# Patient Record
Sex: Male | Born: 1960 | Race: Black or African American | Hispanic: No | Marital: Single | Smoking: Never smoker
Health system: Southern US, Community
[De-identification: ages and names within clinical notes are randomized; demographics above are authoritative.]

## PROBLEM LIST (undated history)

## (undated) DIAGNOSIS — R51 Headache: Secondary | ICD-10-CM

## (undated) DIAGNOSIS — E785 Hyperlipidemia, unspecified: Secondary | ICD-10-CM

## (undated) DIAGNOSIS — H409 Unspecified glaucoma: Secondary | ICD-10-CM

## (undated) DIAGNOSIS — Z9989 Dependence on other enabling machines and devices: Secondary | ICD-10-CM

## (undated) DIAGNOSIS — I10 Essential (primary) hypertension: Secondary | ICD-10-CM

## (undated) DIAGNOSIS — G4733 Obstructive sleep apnea (adult) (pediatric): Secondary | ICD-10-CM

## (undated) DIAGNOSIS — T7840XA Allergy, unspecified, initial encounter: Secondary | ICD-10-CM

## (undated) DIAGNOSIS — R634 Abnormal weight loss: Secondary | ICD-10-CM

## (undated) DIAGNOSIS — F419 Anxiety disorder, unspecified: Secondary | ICD-10-CM

## (undated) DIAGNOSIS — Q120 Congenital cataract: Secondary | ICD-10-CM

## (undated) DIAGNOSIS — K219 Gastro-esophageal reflux disease without esophagitis: Secondary | ICD-10-CM

## (undated) HISTORY — DX: Hyperlipidemia, unspecified: E78.5

## (undated) HISTORY — PX: SHOULDER ARTHROSCOPY WITH ROTATOR CUFF REPAIR: SHX5685

## (undated) HISTORY — DX: Dependence on other enabling machines and devices: Z99.89

## (undated) HISTORY — DX: Abnormal weight loss: R63.4

## (undated) HISTORY — DX: Allergy, unspecified, initial encounter: T78.40XA

## (undated) HISTORY — DX: Anxiety disorder, unspecified: F41.9

## (undated) HISTORY — DX: Obstructive sleep apnea (adult) (pediatric): G47.33

## (undated) HISTORY — DX: Essential (primary) hypertension: I10

## (undated) HISTORY — DX: Congenital cataract: Q12.0

## (undated) HISTORY — DX: Unspecified glaucoma: H40.9

## (undated) HISTORY — DX: Gastro-esophageal reflux disease without esophagitis: K21.9

---
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
## 2003-11-05 ENCOUNTER — Ambulatory Visit (HOSPITAL_COMMUNITY): Admission: RE | Admit: 2003-11-05 | Discharge: 2003-11-05 | Payer: Self-pay | Admitting: Family Medicine

## 2003-11-29 ENCOUNTER — Emergency Department (HOSPITAL_COMMUNITY): Admission: AD | Admit: 2003-11-29 | Discharge: 2003-11-29 | Payer: Self-pay | Admitting: Family Medicine

## 2003-12-20 ENCOUNTER — Encounter: Admission: RE | Admit: 2003-12-20 | Discharge: 2003-12-20 | Payer: Self-pay | Admitting: Emergency Medicine

## 2004-11-07 ENCOUNTER — Emergency Department (HOSPITAL_COMMUNITY): Admission: EM | Admit: 2004-11-07 | Discharge: 2004-11-07 | Payer: Self-pay | Admitting: Family Medicine

## 2005-05-07 ENCOUNTER — Encounter: Admission: RE | Admit: 2005-05-07 | Discharge: 2005-05-07 | Payer: Self-pay | Admitting: Emergency Medicine

## 2005-07-02 ENCOUNTER — Emergency Department (HOSPITAL_COMMUNITY): Admission: EM | Admit: 2005-07-02 | Discharge: 2005-07-02 | Payer: Self-pay | Admitting: Family Medicine

## 2006-01-01 ENCOUNTER — Emergency Department (HOSPITAL_COMMUNITY): Admission: EM | Admit: 2006-01-01 | Discharge: 2006-01-01 | Payer: Self-pay | Admitting: Family Medicine

## 2006-01-12 ENCOUNTER — Encounter: Admission: RE | Admit: 2006-01-12 | Discharge: 2006-01-12 | Payer: Self-pay | Admitting: Emergency Medicine

## 2006-05-04 ENCOUNTER — Emergency Department (HOSPITAL_COMMUNITY): Admission: EM | Admit: 2006-05-04 | Discharge: 2006-05-04 | Payer: Self-pay | Admitting: Family Medicine

## 2006-11-14 ENCOUNTER — Emergency Department (HOSPITAL_COMMUNITY): Admission: EM | Admit: 2006-11-14 | Discharge: 2006-11-14 | Payer: Self-pay | Admitting: Family Medicine

## 2007-11-16 HISTORY — PX: SHOULDER ARTHROSCOPY: SHX128

## 2009-11-04 ENCOUNTER — Emergency Department (HOSPITAL_COMMUNITY): Admission: EM | Admit: 2009-11-04 | Discharge: 2009-11-04 | Payer: Self-pay | Admitting: Emergency Medicine

## 2010-06-17 ENCOUNTER — Encounter: Admission: RE | Admit: 2010-06-17 | Discharge: 2010-06-17 | Payer: Self-pay | Admitting: Family Medicine

## 2011-11-09 ENCOUNTER — Ambulatory Visit (INDEPENDENT_AMBULATORY_CARE_PROVIDER_SITE_OTHER): Payer: Commercial Indemnity

## 2011-11-09 DIAGNOSIS — J209 Acute bronchitis, unspecified: Secondary | ICD-10-CM

## 2011-12-27 ENCOUNTER — Ambulatory Visit (INDEPENDENT_AMBULATORY_CARE_PROVIDER_SITE_OTHER): Payer: Commercial Indemnity | Admitting: Physician Assistant

## 2011-12-27 VITALS — BP 119/79 | HR 69 | Temp 98.7°F | Resp 16 | Ht 69.0 in | Wt 176.0 lb

## 2011-12-27 DIAGNOSIS — Z9989 Dependence on other enabling machines and devices: Secondary | ICD-10-CM

## 2011-12-27 DIAGNOSIS — I10 Essential (primary) hypertension: Secondary | ICD-10-CM | POA: Insufficient documentation

## 2011-12-27 DIAGNOSIS — G43909 Migraine, unspecified, not intractable, without status migrainosus: Secondary | ICD-10-CM | POA: Insufficient documentation

## 2011-12-27 DIAGNOSIS — L509 Urticaria, unspecified: Secondary | ICD-10-CM

## 2011-12-27 DIAGNOSIS — E785 Hyperlipidemia, unspecified: Secondary | ICD-10-CM

## 2011-12-27 DIAGNOSIS — T7840XA Allergy, unspecified, initial encounter: Secondary | ICD-10-CM

## 2011-12-27 DIAGNOSIS — F419 Anxiety disorder, unspecified: Secondary | ICD-10-CM

## 2011-12-27 DIAGNOSIS — J309 Allergic rhinitis, unspecified: Secondary | ICD-10-CM

## 2011-12-27 DIAGNOSIS — L239 Allergic contact dermatitis, unspecified cause: Secondary | ICD-10-CM

## 2011-12-27 DIAGNOSIS — G4733 Obstructive sleep apnea (adult) (pediatric): Secondary | ICD-10-CM | POA: Insufficient documentation

## 2011-12-27 DIAGNOSIS — E291 Testicular hypofunction: Secondary | ICD-10-CM

## 2011-12-27 DIAGNOSIS — K219 Gastro-esophageal reflux disease without esophagitis: Secondary | ICD-10-CM

## 2011-12-27 MED ORDER — PREDNISONE 20 MG PO TABS: ORAL_TABLET | ORAL | Status: AC

## 2011-12-27 MED ORDER — HYDROXYZINE HCL 25 MG PO TABS: 25.0000 mg | ORAL_TABLET | Freq: Three times a day (TID) | ORAL | Status: AC | PRN

## 2011-12-27 NOTE — Patient Instructions (Signed)
Take medications as prescribed.  Try to keep cool, the hotter you get, the itchier you'll be!  Stay well hydrated.

## 2011-12-27 NOTE — Progress Notes (Signed)
  Subjective:    Patient ID: William [name], male    DOB: 1961/09/12, 51 y.o.   MRN: [id number]  HPI The patient presents with itchy rash in the antecubital fossa bilaterally as well as the upper chest. The symptoms began several hours after applying hair to test his sensitivity to it. No oral or pulmonary symptoms. No headache. Oral Benadryl and topical hydrocortisone have been ineffective.   Review of Systems No chest pain, shortness of breath, tongue swelling, throat scratchiness.    Objective:   Physical Exam Urticarial changes noted in the antecubital fossa bilaterally. Some mild increased erythema of the upper chest without hives.     Assessment & Plan:  Allergic reaction. Urticaria.  Hydroxyzine 25 mg one by mouth Q8 hours when necessary (advise just at bedtime secondary to sedative side effects). Prednisone taper starting tomorrow morning. Hydrate. Keep cool. Reevaluate if symptoms worsen or persist.

## 2012-02-22 ENCOUNTER — Telehealth: Payer: Self-pay

## 2012-02-22 MED ORDER — EZETIMIBE 10 MG PO TABS: 10.0000 mg | ORAL_TABLET | Freq: Every day | ORAL | Status: DC

## 2012-02-22 NOTE — Telephone Encounter (Signed)
.  [facility] Patient called to request refill of Zetia be sent to [facility].  Patient stated that there had been some issues with this medication getting refilled. Please call patient at [phone].

## 2012-02-22 NOTE — Telephone Encounter (Signed)
Advised pt that rx was sent in and needs an office visit before running out

## 2012-03-06 ENCOUNTER — Ambulatory Visit (INDEPENDENT_AMBULATORY_CARE_PROVIDER_SITE_OTHER): Payer: Commercial Indemnity | Admitting: Family Medicine

## 2012-03-06 VITALS — BP 130/83 | HR 59 | Temp 98.1°F | Resp 16 | Ht 66.38 in | Wt 176.6 lb

## 2012-03-06 DIAGNOSIS — R21 Rash and other nonspecific skin eruption: Secondary | ICD-10-CM

## 2012-03-06 MED ORDER — TERBINAFINE HCL 250 MG PO TABS: 250.0000 mg | ORAL_TABLET | Freq: Every day | ORAL | Status: AC

## 2012-03-06 NOTE — Progress Notes (Signed)
  Patient Name: William [name] Date of Birth: 03-10-1961 Medical Record Number: [id number] Gender: male Date of Encounter: 03/06/2012  History of Present Illness:  William [name] is a 51 y.o. very pleasant male patient who presents with the following:  Has had a rash on his neck for about 2 weeks.  It got worse after he wore a shirt/ tie to church and it rubbed on the area.  He switched from a regular razor to an Neurosurgeon but it did not help.  He noted some round, scaly areas and was wondering about ringworm- he applied a topical antifungal last night.  He has never had problems with irritation or redness of his neck in the past and notes that he does not have much hair growth in his beard area which helps. Otherwise is feeling well.    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 HTN (hypertension)  . Hyperlipidemia  . GERD (gastroesophageal reflux disease)  . OSA on CPAP  . Hypogonadism male  . Migraine  . AR (allergic rhinitis)  . Anxiety   Past Medical History  Diagnosis Date  . GERD (gastroesophageal reflux disease)   . Hypertension   . Hyperlipidemia   . Allergy   . Anxiety   . OSA on CPAP    No past surgical history on file. History  Substance Use Topics  . Smoking status: Never Smoker   . Smokeless tobacco: Not on file  . Alcohol Use: Not on file   No family history on file. No Known Allergies  Medication list has been reviewed and updated.  Review of Systems: As per HPI- otherwise negative.  Physical Examination: Filed Vitals:   03/06/12 1819  BP: 130/83  Pulse: 59  Temp: 98.1 F (36.7 C)  TempSrc: Oral  Resp: 16  Height: 5' 6.38" (1.686 m)  Weight: 176 lb 9.6 oz (80.105 kg)    Body mass index is 28.18 kg/(m^2).   GEN: WDWN, NAD, Non-toxic, Alert & Oriented x 3 HEENT: Atraumatic, Normocephalic.  Ears and Nose: No external deformity. EXTR: No clubbing/cyanosis/edema NEURO: Normal gait.  PSYCH:  Normally interactive. Conversant. Not depressed or anxious appearing.  Calm demeanor.   There is a round, scaly are on the right cheek and similar area on the right neck.  Did a scraping but did not get a great sample Results for orders placed in visit on 03/06/12  POCT SKIN KOH      Component Value Range   Skin KOH, POC Negative     Assessment and Plan: 1. Rash  POCT Skin KOH, terbinafine (LAMISIL) 250 MG tablet   Suspect that rash may be due to tinea- he would like to try treating this orally.   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  William [name] to recheck his labs prior to starting medication- as he has normal labs in September he declined to recheck today.  He will let me know if not getting better- Sooner if worse.

## 2012-04-01 ENCOUNTER — Telehealth: Payer: Self-pay

## 2012-04-01 NOTE — Telephone Encounter (Signed)
Pt's rx company keeps losing his rx and he is not able to get his androgel. He would like to have [place] [facility]

## 2012-04-02 MED ORDER — TESTOSTERONE 12.5 MG/ACT (1%) TD GEL: 5.0000 g | Freq: Every day | TRANSDERMAL | Status: DC

## 2012-04-02 NOTE — Telephone Encounter (Signed)
Will review patient chart.

## 2012-04-02 NOTE — Telephone Encounter (Signed)
Please call patient.  1) verify that he wants the Rx to [facility], not [facility]. I'm waiting to authorize it until we're sure.  2) He'll need re-evaluation for additional refills.

## 2012-04-02 NOTE — Telephone Encounter (Signed)
[facility] has been bought out by [employer].  Will fax in rx and call pt to advise recheck.

## 2012-06-08 ENCOUNTER — Ambulatory Visit (INDEPENDENT_AMBULATORY_CARE_PROVIDER_SITE_OTHER): Payer: Commercial Indemnity | Admitting: Family Medicine

## 2012-06-08 ENCOUNTER — Encounter: Payer: Self-pay | Admitting: Family Medicine

## 2012-06-08 VITALS — BP 136/102 | HR 87 | Temp 98.2°F | Resp 16 | Ht 67.0 in | Wt 172.8 lb

## 2012-06-08 DIAGNOSIS — I10 Essential (primary) hypertension: Secondary | ICD-10-CM

## 2012-06-08 DIAGNOSIS — E291 Testicular hypofunction: Secondary | ICD-10-CM

## 2012-06-08 DIAGNOSIS — Z0000 Encounter for general adult medical examination without abnormal findings: Secondary | ICD-10-CM

## 2012-06-08 DIAGNOSIS — M791 Myalgia, unspecified site: Secondary | ICD-10-CM

## 2012-06-08 DIAGNOSIS — Z202 Contact with and (suspected) exposure to infections with a predominantly sexual mode of transmission: Secondary | ICD-10-CM

## 2012-06-08 DIAGNOSIS — J309 Allergic rhinitis, unspecified: Secondary | ICD-10-CM

## 2012-06-08 DIAGNOSIS — E785 Hyperlipidemia, unspecified: Secondary | ICD-10-CM

## 2012-06-08 DIAGNOSIS — R361 Hematospermia: Secondary | ICD-10-CM

## 2012-06-08 DIAGNOSIS — G44009 Cluster headache syndrome, unspecified, not intractable: Secondary | ICD-10-CM

## 2012-06-08 LAB — POCT URINALYSIS DIPSTICK
Bilirubin, UA: NEGATIVE
Glucose, UA: NEGATIVE
Leukocytes, UA: NEGATIVE
Nitrite, UA: NEGATIVE
Protein, UA: 30
Spec Grav, UA: 1.02
Urobilinogen, UA: 0.2
pH, UA: 7

## 2012-06-08 LAB — LIPID PANEL
Cholesterol: 218 mg/dL — ABNORMAL HIGH (ref 0–200)
HDL: 45 mg/dL (ref >39)
LDL Cholesterol: 155 mg/dL — ABNORMAL HIGH (ref 0–99)
Total CHOL/HDL Ratio: 4.8 Ratio
Triglycerides: 89 mg/dL (ref <150)
VLDL: 18 mg/dL (ref 0–40)

## 2012-06-08 LAB — CBC
HCT: 43.1 % (ref 39.0–52.0)
Hemoglobin: 15.3 g/dL (ref 13.0–17.0)
MCH: 29.5 pg (ref 26.0–34.0)
MCHC: 35.5 g/dL (ref 30.0–36.0)
MCV: 83 fL (ref 78.0–100.0)
Platelets: 235 10*3/uL (ref 150–400)
RBC: 5.19 MIL/uL (ref 4.22–5.81)
RDW: 15.1 % (ref 11.5–15.5)
WBC: 4 10*3/uL (ref 4.0–10.5)

## 2012-06-08 LAB — POCT UA - MICROSCOPIC ONLY
Casts, Ur, LPF, POC: NEGATIVE
Crystals, Ur, HPF, POC: NEGATIVE
Mucus, UA: POSITIVE
Yeast, UA: NEGATIVE

## 2012-06-08 LAB — COMPREHENSIVE METABOLIC PANEL
ALT: 27 U/L (ref 0–53)
AST: 32 U/L (ref 0–37)
Albumin: 4.3 g/dL (ref 3.5–5.2)
Alkaline Phosphatase: 45 U/L (ref 39–117)
BUN: 13 mg/dL (ref 6–23)
CO2: 26 mEq/L (ref 19–32)
Calcium: 9.5 mg/dL (ref 8.4–10.5)
Chloride: 104 mEq/L (ref 96–112)
Creat: 1.19 mg/dL (ref 0.50–1.35)
Glucose, Bld: 92 mg/dL (ref 70–99)
Potassium: 4 mEq/L (ref 3.5–5.3)
Sodium: 139 mEq/L (ref 135–145)
Total Bilirubin: 0.6 mg/dL (ref 0.3–1.2)
Total Protein: 7.2 g/dL (ref 6.0–8.3)

## 2012-06-08 LAB — TSH: TSH: 1.492 u[IU]/mL (ref 0.350–4.500)

## 2012-06-08 LAB — PSA: PSA: 0.63 ng/mL (ref <=4.00)

## 2012-06-08 LAB — IFOBT (OCCULT BLOOD): IFOBT: NEGATIVE

## 2012-06-08 MED ORDER — TESTOSTERONE 12.5 MG/ACT (1%) TD GEL: 5.0000 g | Freq: Every day | TRANSDERMAL | Status: DC

## 2012-06-08 MED ORDER — FLUTICASONE PROPIONATE 50 MCG/ACT NA SUSP: 2.0000 | Freq: Every day | NASAL | Status: DC

## 2012-06-08 MED ORDER — VERAPAMIL HCL 240 MG (CO) PO TB24: 240.0000 mg | ORAL_TABLET | Freq: Every day | ORAL | Status: DC

## 2012-06-08 MED ORDER — SUMATRIPTAN SUCCINATE 100 MG PO TABS: 100.0000 mg | ORAL_TABLET | ORAL | Status: DC | PRN

## 2012-06-08 MED ORDER — EZETIMIBE 10 MG PO TABS: 10.0000 mg | ORAL_TABLET | Freq: Every day | ORAL | Status: DC

## 2012-06-08 MED ORDER — CYCLOBENZAPRINE HCL 10 MG PO TABS: 10.0000 mg | ORAL_TABLET | ORAL | Status: DC | PRN

## 2012-06-08 NOTE — Progress Notes (Signed)
122/92  @[facility] @  Patient ID: William [name] MRN: [id number], DOB: 21-Nov-1960 51 y.o. Date of Encounter: 06/08/2012, 9:17 AM  Primary Physician: No primary provider on file.  Chief Complaint: Physical (CPE)  HPI: 51 y.o. y/o male with history noted below here for CPE.  Doing well. 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 He's not very happy with his job currently. Had a relationship and was exiting gauge last year and then found out difficulties including the fact that his fiance had contracted HSV. He recently has noted some hematospermia . These issues have caused increased stress lately and patient thinks some of his elevated blood pressure may be because of this. He is working out with a Psychologist, educational at [employer] for exercise. He's having no further problems with the shoulders except occasional myalgia.  Review of Systems: Consitutional: No fever, chills, fatigue, night sweats, lymphadenopathy, or weight changes. Eyes: No visual changes, eye redness, or discharge. ENT/Mouth: Ears: No otalgia, tinnitus, hearing loss, discharge. Nose: No congestion, rhinorrhea, sinus pain, or epistaxis. Throat: No sore throat, post nasal drip, or teeth pain. Cardiovascular: No CP, palpitations, diaphoresis, DOE, edema, orthopnea, PND. Respiratory: No cough, hemoptysis, SOB, or wheezing. Gastrointestinal: No anorexia, dysphagia, reflux, pain, nausea, vomiting, hematemesis, diarrhea, constipation, BRBPR, or melena. Genitourinary: No dysuria, frequency, urgency, hematuria, incontinence, nocturia, decreased urinary stream, discharge, impotence, or testicular pain/masses. Musculoskeletal: No decreased ROM, myalgias, stiffness, joint swelling, or weakness. Skin: No rash, erythema, lesion changes, pain, warmth, jaundice, or pruritis. Neurological: No headache, dizziness, syncope, seizures, tremors, memory loss, coordination problems, or  paresthesias. Psychological: No anxiety, depression, hallucinations, SI/HI. Endocrine: No fatigue, polydipsia, polyphagia, polyuria, or known diabetes. All other systems were reviewed and are otherwise negative.  Past Medical History  Diagnosis Date  . GERD (gastroesophageal reflux disease)   . Hypertension   . Hyperlipidemia   . Allergy   . Anxiety   . OSA on CPAP      No past surgical history on file.  Home Meds:  Prior to Admission medications   Medication Sig Start Date End Date Taking? Authorizing Provider  cyclobenzaprine (FLEXERIL) 10 MG tablet Take 1 tablet (10 mg total) by mouth as needed. 06/08/12  Yes Elvina Sidle, MD  ezetimibe (ZETIA) 10 MG tablet Take 1 tablet (10 mg total) by mouth daily. 06/08/12  Yes Elvina Sidle, MD  fluticasone [facility]) 50 MCG/ACT nasal spray Place 2 sprays into the nose daily. 06/08/12  Yes Elvina Sidle, MD  latanoprost (XALATAN) 0.005 % ophthalmic solution Place 1 drop into both eyes at bedtime.   Yes Historical Provider, MD  Multiple Vitamin (MULTIVITAMIN) capsule Take 1 capsule by mouth daily.   Yes Historical Provider, MD  SUMAtriptan (IMITREX) 100 MG tablet Take 1 tablet (100 mg total) by mouth every 2 (two) hours as needed. 06/08/12  Yes Elvina Sidle, MD  Testosterone 1.25 GM/ACT (1%) GEL Place 5 g onto the skin daily. 06/08/12  Yes Elvina Sidle, MD  verapamil (COVERA HS) 240 MG (CO) 24 hr tablet Take 1 tablet (240 mg total) by mouth daily. 06/08/12  Yes Elvina Sidle, MD  terbinafine (LAMISIL) 250 MG tablet Take 1 tablet (250 mg total) by mouth daily. 03/06/12 03/06/13  Pearline Cables, MD    Allergies: Not on File  History   Social History  . Marital Status: Married    Spouse Name: N/A    Number of Children: N/A  . Years of  Education: N/A   Occupational History  . Not on file.   Social History Main Topics  . Smoking status: Never Smoker   . Smokeless tobacco: Not on file  . Alcohol Use: Not on file  . Drug Use:  Not on file  . Sexually Active: Not on file   Other Topics Concern  . Not on file   Social History Narrative  . No narrative on file    No family history on file. his father had prostate cancer which was treated with surgery successfully and is still alive. He also has a family history of glaucoma degrees and and hypercholesterolemia.  Physical Exam:  BP 122/92 when rechecked by me. Blood pressure 136/102, pulse 87, temperature 98.2 F (36.8 C), temperature source Oral, resp. rate 16, height 5\' 7"  (1.702 m), weight 172 lb 12.8 oz (78.382 kg), SpO2 99.00%.  General: Well developed, well nourished, in no acute distress. HEENT: Normocephalic, atraumatic. Conjunctiva pink, sclera non-icteric. Pupils 2 mm constricting to 1 mm, round, regular, and equally reactive to light and accomodation. EOMI. Internal auditory canal clear. TMs with good cone of light and without pathology. Nasal mucosa pink. Nares are without discharge. No sinus tenderness. Oral mucosa pink. Dentition good. Pharynx without exudate.   Neck: Supple. Trachea midline. No thyromegaly. Full ROM. No lymphadenopathy. Lungs: Clear to auscultation bilaterally without wheezes, rales, or rhonchi. Breathing is of normal effort and unlabored. Cardiovascular: RRR with S1 S2. No murmurs, rubs, or gallops appreciated. Distal pulses 2+ symmetrically. No carotid or abdominal bruits. Abdomen: Soft, non-tender, non-distended with normoactive bowel sounds. No hepatosplenomegaly or masses. No rebound/guarding. No CVA tenderness. Without hernias.  Rectal: No external hemorrhoids or fissures. Rectal vault without masses.  Genitourinary:  circumcised male. No penile lesions. Testes descended bilaterally, and smooth without tenderness or masses.  Musculoskeletal: Full range of motion and 5/5 strength throughout. Without swelling, atrophy, tenderness, crepitus, or warmth. Extremities without clubbing, cyanosis, or edema. Calves supple. Skin: Warm and  moist without erythema, ecchymosis, wounds, or rash. Patient does have keloid formation where he had shoulder arthroscopy on each of his shoulders. Neuro: A+Ox3. CN II-XII grossly intact. Moves all extremities spontaneously. Full sensation throughout. Normal gait. DTR 2+ throughout upper and lower extremities. Finger to nose intact. Psych:  Responds to questions appropriately with a normal affect.   Studies:  .  Assessment/Plan:  51 y.o. y/o healthy male here for CPE - 1. Hypertension  CBC, Comprehensive metabolic panel, TSH, POCT UA - Microscopic Only, POCT urinalysis dipstick, verapamil (COVERA HS) 240 MG (CO) 24 hr tablet  2. Hematospermia  PSA  3. Hyperlipidemia  Lipid panel, ezetimibe (ZETIA) 10 MG tablet  4. Hypogonadism male  Testosterone 1.25 GM/ACT (1%) GEL  5. Headaches, cluster  SUMAtriptan (IMITREX) 100 MG tablet  6. Allergic rhinitis  fluticasone (FLONASE) 50 MCG/ACT nasal spray  7. Myalgia  cyclobenzaprine (FLEXERIL) 10 MG tablet   HSV blood test added to labs.  Signed, Elvina Sidle, MD 06/08/2012 9:17 AM

## 2012-06-09 LAB — HSV(HERPES SIMPLEX VRS) I + II AB-IGG
HSV 1 Glycoprotein G Ab, IgG: 0.23 IV
HSV 2 Glycoprotein G Ab, IgG: 4.71 IV — ABNORMAL HIGH

## 2012-06-13 ENCOUNTER — Telehealth: Payer: Self-pay

## 2012-06-13 NOTE — Telephone Encounter (Signed)
Called [facility], His medicine is discontinued, Covera-HS 240 mg. The other alternatives are Verapamil generic. I advised generic verapamil ok to use.

## 2012-06-13 NOTE — Telephone Encounter (Signed)
Patient seen recently by Dr. Milus Glazier and had orders sent to [facility]. [facility] has questions and patient states this needs to be taken care of by today according to [facility]. The reference # is [id number] and the [facility] # is [phone].  Patient call back number is [phone] (cell).

## 2012-07-14 ENCOUNTER — Ambulatory Visit (INDEPENDENT_AMBULATORY_CARE_PROVIDER_SITE_OTHER): Payer: Commercial Indemnity | Admitting: Physician Assistant

## 2012-07-14 VITALS — BP 110/68 | HR 64 | Temp 97.8°F | Resp 16 | Ht 67.0 in | Wt 173.0 lb

## 2012-07-14 DIAGNOSIS — H6690 Otitis media, unspecified, unspecified ear: Secondary | ICD-10-CM

## 2012-07-14 DIAGNOSIS — H6980 Other specified disorders of Eustachian tube, unspecified ear: Secondary | ICD-10-CM

## 2012-07-14 DIAGNOSIS — H6990 Unspecified Eustachian tube disorder, unspecified ear: Secondary | ICD-10-CM

## 2012-07-14 MED ORDER — PREDNISONE 20 MG PO TABS: ORAL_TABLET | ORAL | Status: AC

## 2012-07-14 MED ORDER — AMOXICILLIN 875 MG PO TABS: 875.0000 mg | ORAL_TABLET | Freq: Two times a day (BID) | ORAL | Status: AC

## 2012-07-14 NOTE — Progress Notes (Signed)
  Subjective:    Patient ID: KMARION RAWL, male    DOB: [birth year], 51 y.o.   MRN: [id number]  HPI 51 year old male presents with 5 day history of right ear pressure, some otalgia, and decreased hearing.  Denies any recent URI - no nasal congestion, sore throat, cough, headache, fever, or chills. Did have a cerumen impaction that he had irrigated at his physical about 1 month ago, but has had no problems since then. Flushed his ear with water at home using a syringe but did not get much wax out.      Review of Systems  All other systems reviewed and are negative.       Objective:   Physical Exam  Constitutional: He is oriented to person, place, and time. He appears well-developed and well-nourished.  HENT:  Head: Normocephalic and atraumatic.  Right Ear: Hearing, external ear and ear canal normal. A middle ear effusion is present.  Left Ear: Hearing, tympanic membrane, external ear and ear canal normal.  Mouth/Throat: Uvula is midline, oropharynx is clear and moist and mucous membranes are normal.  Eyes: Conjunctivae are normal.  Neck: Normal range of motion.  Cardiovascular: Normal rate, regular rhythm and normal heart sounds.   Pulmonary/Chest: Effort normal and breath sounds normal.  Lymphadenopathy:    He has no cervical adenopathy.  Neurological: He is alert and oriented to person, place, and time.  Psychiatric: He has a normal mood and affect. His behavior is normal. Judgment and thought content normal.          Assessment & Plan:   1. Otitis media  amoxicillin (AMOXIL) 875 MG tablet  2. ETD (eustachian tube dysfunction)  predniSONE (DELTASONE) 20 MG tablet   Recommend OTC zyrtec daily in the morning.  Will cover with amoxicillin. Follow up if symptoms worsen or fail to improve.

## 2012-07-14 NOTE — Patient Instructions (Addendum)
Amoxicillin twice daily x 10 days Prednisone taper over 6-9 days Recommend OTC Zyrtec (cetirizine) daily in the  morning

## 2012-09-07 ENCOUNTER — Telehealth: Payer: Self-pay

## 2012-09-07 DIAGNOSIS — E291 Testicular hypofunction: Secondary | ICD-10-CM

## 2012-09-07 NOTE — Telephone Encounter (Signed)
[facility] REQUESTING A REFILL ON PT'S ANDROGEL. PLEASE CALL [phone] AND THE REFERENCE IS [phone]

## 2012-09-07 NOTE — Telephone Encounter (Signed)
I think an OV would be more appropriate considering we have not checked patient testosterone levels within 6 months.  Please call pt and see what his f/u plan is.

## 2012-09-07 NOTE — Telephone Encounter (Signed)
LMOM to CB. Jenna 

## 2012-09-08 ENCOUNTER — Other Ambulatory Visit: Payer: Self-pay | Admitting: Family Medicine

## 2012-09-08 DIAGNOSIS — E291 Testicular hypofunction: Secondary | ICD-10-CM

## 2012-09-08 NOTE — Telephone Encounter (Signed)
Pt CB and stated that he had just been in the end of July for his PE and labs and he assumed that his testosterone would be checked then. His PSA was checked but it looks like we did not check his testosterone level - the last testosterone lab we have in his chart is from 12/2010. Pt requests that we put in on order for testosterone to be checked so that he can come in for Lab Only.  Please route to Dr Milus Glazier afterward for the following question: pt wondered whether you would change him to a different chol medication since his lipids are still elevated and d/t his family Hx. He is on Zetia now. You had originally put him on pravastatin but it was causing him leg pain. Please advise.

## 2012-09-08 NOTE — Telephone Encounter (Signed)
Olegario Messier with [facility] called again to request information on patient's Androgel rx.  Please call [phone] and  reference # [id number] when calling.

## 2012-09-12 ENCOUNTER — Telehealth: Payer: Self-pay | Admitting: Radiology

## 2012-09-12 ENCOUNTER — Other Ambulatory Visit (INDEPENDENT_AMBULATORY_CARE_PROVIDER_SITE_OTHER): Payer: Commercial Indemnity | Admitting: Physician Assistant

## 2012-09-12 VITALS — BP 119/78

## 2012-09-12 DIAGNOSIS — E291 Testicular hypofunction: Secondary | ICD-10-CM

## 2012-09-12 NOTE — Telephone Encounter (Signed)
Patient called, states was not aware labs needed for renewal of testosterone, he will come in.

## 2012-09-12 NOTE — Progress Notes (Signed)
Patient here for lab only.  Not seen by a provider.

## 2012-09-13 LAB — TESTOSTERONE: Testosterone: 240.47 ng/dL — ABNORMAL LOW (ref 300–890)

## 2012-09-14 ENCOUNTER — Other Ambulatory Visit: Payer: Self-pay | Admitting: Family Medicine

## 2012-09-14 DIAGNOSIS — E291 Testicular hypofunction: Secondary | ICD-10-CM

## 2012-09-15 ENCOUNTER — Telehealth: Payer: Self-pay | Admitting: *Deleted

## 2012-09-15 DIAGNOSIS — E291 Testicular hypofunction: Secondary | ICD-10-CM

## 2012-09-15 NOTE — Telephone Encounter (Signed)
Pt had got a phone call about lab work and stated that he needed to be referred to urology.  He stated that he had been out of his Testosterone med for about a week and that probably why the level was low.  Do you really want him to go urology or do you want to just refill his testosterone med.  If you are going to refill his testosterone med if you can he needs a local rx for a 1 month supply sent to [place] [facility] and one with refills [facility].

## 2012-09-16 NOTE — Telephone Encounter (Signed)
Thanks, no urology appt now, just refill and we'll recheck when he's been on testosterone for at least 2 months

## 2012-09-18 MED ORDER — TESTOSTERONE 50 MG/5GM (1%) TD GEL: 5.0000 g | Freq: Every day | TRANSDERMAL | Status: DC

## 2012-09-18 NOTE — Telephone Encounter (Signed)
Pt checking status of testosterone refill. Supposed to go to [facility] for immediate refill ( he has been out of rx for a week) and then to mail order for rest of supply.  Please call pt with status of this.  [phone]  bf

## 2012-09-18 NOTE — Telephone Encounter (Signed)
Pt notified that rx was sent to pharmacy and also one sent to mail order pharmacy.

## 2012-10-08 ENCOUNTER — Encounter: Payer: Self-pay | Admitting: Family Medicine

## 2012-12-30 ENCOUNTER — Ambulatory Visit (INDEPENDENT_AMBULATORY_CARE_PROVIDER_SITE_OTHER): Payer: BC Managed Care – PPO | Admitting: Family Medicine

## 2012-12-30 VITALS — BP 144/84 | HR 70 | Temp 97.8°F | Resp 16 | Ht 67.25 in | Wt 180.8 lb

## 2012-12-30 DIAGNOSIS — N529 Male erectile dysfunction, unspecified: Secondary | ICD-10-CM

## 2012-12-30 DIAGNOSIS — I10 Essential (primary) hypertension: Secondary | ICD-10-CM

## 2012-12-30 DIAGNOSIS — Z823 Family history of stroke: Secondary | ICD-10-CM

## 2012-12-30 DIAGNOSIS — E291 Testicular hypofunction: Secondary | ICD-10-CM

## 2012-12-30 DIAGNOSIS — E785 Hyperlipidemia, unspecified: Secondary | ICD-10-CM

## 2012-12-30 LAB — LIPID PANEL
HDL: 45 mg/dL (ref >39)
LDL Cholesterol: 125 mg/dL — ABNORMAL HIGH (ref 0–99)
Total CHOL/HDL Ratio: 4.3 Ratio
VLDL: 23 mg/dL (ref 0–40)

## 2012-12-30 LAB — COMPREHENSIVE METABOLIC PANEL
ALT: 21 U/L (ref 0–53)
AST: 30 U/L (ref 0–37)
Alkaline Phosphatase: 46 U/L (ref 39–117)
BUN: 11 mg/dL (ref 6–23)
Creat: 1.32 mg/dL (ref 0.50–1.35)

## 2012-12-30 LAB — TESTOSTERONE: Testosterone: 364 ng/dL (ref 300–890)

## 2012-12-30 MED ORDER — TESTOSTERONE 50 MG/5GM (1%) TD GEL: 5.0000 g | Freq: Every day | TRANSDERMAL | Status: DC

## 2012-12-30 MED ORDER — TADALAFIL 20 MG PO TABS: 20.0000 mg | ORAL_TABLET | ORAL | Status: DC | PRN

## 2012-12-30 MED ORDER — PRAVASTATIN SODIUM 40 MG PO TABS: 40.0000 mg | ORAL_TABLET | Freq: Every day | ORAL | Status: DC

## 2012-12-30 NOTE — Progress Notes (Signed)
Subjective:    Patient ID: William [name], male    DOB: 1961-03-27, 52 y.o.   MRN: [id number]  HPI William [name] is a 52 y.o. male PCP - Dr. Milus Glazier  Hyperlipidemia - new insurance - won't cover zetia, will cover pravastatin.  Did have slight myalgia with one statin, but no other reaction. Hx of hyperlipidemia, FH of CVA - both grandparents, and mom with multiple TIA's, and possible stroke. No FH of CAD. Prior on baby asa, just off recently.   Hypogonadism - takes androgel 1 packet per day. New insurance requires call to cover. Last level 240 in October, but had been off meds at that time.  Also takes Cialis 20mg  - takes full pill - takes intermittently - up to few times per week - usually gets 12 at a time - fills locally.   No chest pains, lightheadedness or dizziness.    Results for orders placed in visit on 09/12/12  TESTOSTERONE      Result Value Range   Testosterone 240.47 (*) 300 - 890 ng/dL     HTN - outside bp - 125-130/90 at home. No new side effects of meds. Sip of tea for meds only this am - otherwise fasting.      Review of Systems  Constitutional: Negative for fatigue and unexpected weight change (gained 10 pounds when taking prednisone. ).  Eyes: Negative for visual disturbance.  Respiratory: Negative for cough, chest tightness and shortness of breath.   Cardiovascular: Negative for chest pain, palpitations and leg swelling.  Gastrointestinal: Negative for abdominal pain and blood in stool.  Neurological: Positive for headaches (rarely, but no recent migraines - better on verapamil. ). Negative for dizziness and light-headedness.       Objective:   Physical Exam  Vitals reviewed. Constitutional: He is oriented to person, place, and time. He appears well-developed and well-nourished.  HENT:  Head: Normocephalic and atraumatic.  Eyes: EOM are normal. Pupils are equal, round, and reactive to light.  Neck: No JVD present. Carotid bruit is not present.   Cardiovascular: Normal rate, regular rhythm and normal heart sounds.   No murmur heard. Pulmonary/Chest: Effort normal and breath sounds normal. He has no rales.  Musculoskeletal: He exhibits no edema.  Neurological: He is alert and oriented to person, place, and time.  Skin: Skin is warm and dry.  Psychiatric: He has a normal mood and affect.          Assessment & Plan:  William [name] is a 52 y.o. male.   Hyperlipidemia - Plan: Comprehensive metabolic panel, pravastatin (PRAVACHOL) 40 MG tablet - started in place of Zetia for insurance coverage, and FH of CVD.  6 weeks -  Comprehensive metabolic panel, Lipid panel.   HTN (hypertension) - Plan: Comprehensive metabolic panel, Lipid panel. Stable at home.  Hypogonadism male - Plan: Testosterone, tadalafil (CIALIS) 20 MG tablet refilled. , testosterone (ANDROGEL) 50 MG/5GM GEL - same dose - check level.  ppwk for insurance coverage to be reviewed by team leader.   Family history of stroke - sa above.  Plan on restarting asa 81mg  qd.   Impotence due to erectile dysfunction - Plan: tadalafil (CIALIS) 20 MG tablet   Patient Instructions  Stop zetia, start pravastatin 40mg  each day. Recheck for liver tests in 6 weeks. - lab only visit ok.  If any new muscle aches, return to clinic to discuss.  Your should receive a call or letter about your lab results within the next week  to 10 days.  Continue the same doses of your other meds for now.

## 2012-12-30 NOTE — Patient Instructions (Addendum)
Stop zetia, start pravastatin 40mg  each day. Recheck for liver tests in 6 weeks. - lab only visit ok.  If any new muscle aches, return to clinic to discuss.  Your should receive a call or letter about your lab results within the next week to 10 days.  Continue the same doses of your other meds for now.

## 2013-01-02 ENCOUNTER — Other Ambulatory Visit: Payer: Self-pay | Admitting: Radiology

## 2013-01-03 NOTE — Progress Notes (Signed)
Received a form to be filled out for prior auth of pt's Androgel. I have completed form and faxed to [facility]. Waiting for decision.

## 2013-01-04 NOTE — Progress Notes (Signed)
Received letter of approval for Androgel through 01/03/14 and notified pt it was approved.

## 2013-01-27 ENCOUNTER — Ambulatory Visit (INDEPENDENT_AMBULATORY_CARE_PROVIDER_SITE_OTHER): Payer: BC Managed Care – PPO | Admitting: Family Medicine

## 2013-01-27 VITALS — BP 142/92 | HR 86 | Temp 98.1°F | Resp 16 | Ht 67.0 in | Wt 179.0 lb

## 2013-01-27 DIAGNOSIS — B009 Herpesviral infection, unspecified: Secondary | ICD-10-CM

## 2013-01-27 MED ORDER — VALACYCLOVIR HCL 500 MG PO TABS: 500.0000 mg | ORAL_TABLET | Freq: Two times a day (BID) | ORAL | Status: DC

## 2013-01-27 NOTE — Patient Instructions (Addendum)
Herpes Labialis You have a fever blister or cold sore (herpes labialis). These painful, grouped sores are caused by one of the herpes viruses (HSV1 most commonly). They are usually found around the lips and mouth, but the same infection can also affect other areas on the face such as the nose and eyes. Herpes infections take about 10 days to heal. They often occur again and again in the same spot. Other symptoms may include numbness and tingling in the involved skin, achiness, fever, and swollen glands in the neck. Colds, emotional stress, injuries, or excess sunlight exposure all seem to make herpes reappear. Herpes lip infections are contagious. Direct contact with these sores can spread the infection. It can also be spread to other parts of your own body. TREATMENT  Herpes labialis is usually self-limited and resolves within 1 week. To reduce pain and swelling, apply ice packs frequently to the sores or suck on popsicles or frozen juice bars. Antiviral medicine may be used by mouth to shorten the duration of the breakout. Avoid spreading the infection by washing your hands often. Be careful not to touch your eyes or genital areas after handling the infected blisters. Do not kiss or have other intimate contact with others. After the blisters are completely healed you may resume contact. Use sunscreen to lessen recurrences.  If this is your first infection with herpes, or if you have a severe or repeated infections, your caregiver may prescribe one of the anti-viral drugs to speed up the healing. If you have sun-related flare-ups despite the use of sunscreen, starting oral anti-viral medicine before a prolonged exposure (going skiing or to the beach) can prevent most episodes.  SEEK IMMEDIATE MEDICAL CARE IF:  You develop a headache, sleepiness, high fever, vomiting, or severe weakness.  You have eye irritation, pain, blurred vision or redness.  You develop a prolonged infection not getting better in 10  days. Document Released: 11/01/2005 Document Revised: 01/24/2012 Document Reviewed: 09/05/2009 [employer] Patient Information 2013 [employer].  

## 2013-01-27 NOTE — Progress Notes (Signed)
52 year old man has had a rash on his genitals for the second time in about a year. First time we tested him for HSV and came back positive and his symptoms now are similar to what he had last July.  Objective: Examination of the penis reveals a scaly slightly swollen corona.  Assessment: Recurrence of HSV 2  Plan: Valtrex 500 twice a day for 3 days refills x11

## 2013-02-14 ENCOUNTER — Telehealth: Payer: Self-pay | Admitting: *Deleted

## 2013-02-21 ENCOUNTER — Ambulatory Visit: Payer: Self-pay | Admitting: Neurology

## 2013-02-26 NOTE — Telephone Encounter (Signed)
Patient is deceased per , informed Dr  Vickey Huger

## 2013-02-28 NOTE — Telephone Encounter (Signed)
This encounter was created in error - please disregard.

## 2013-02-28 NOTE — Addendum Note (Signed)
Addended by: Harlon Flor, Zoa Dowty L on: 02/28/2013 03:43 PM   Modules accepted: Level of Service, SmartSet

## 2013-03-19 ENCOUNTER — Encounter: Payer: Self-pay | Admitting: Neurology

## 2013-03-19 ENCOUNTER — Ambulatory Visit (INDEPENDENT_AMBULATORY_CARE_PROVIDER_SITE_OTHER): Payer: BC Managed Care – PPO | Admitting: Neurology

## 2013-03-19 VITALS — BP 127/87 | HR 69 | Ht 67.0 in | Wt 178.0 lb

## 2013-03-19 DIAGNOSIS — G4733 Obstructive sleep apnea (adult) (pediatric): Secondary | ICD-10-CM

## 2013-03-19 DIAGNOSIS — Z9989 Dependence on other enabling machines and devices: Secondary | ICD-10-CM

## 2013-03-19 NOTE — Progress Notes (Addendum)
Chief Complaint  Patient presents with  . Follow-up    CPAP compliance visit - user since 06-2009 and 10 cm water -AHP is DME  , rm 11  [facility]  Provider:  Dr Fayola Meckes Referring Provider: Elvina Sidle, MD Primary Care Physician:  Elvina Sidle, MD  Chief Complaint  Patient presents with  . Follow-up    CPAP compliance visit - user since 06-2009 and 10 cm water -AHP is DME  , rm 11    HPI:  William [name] is a 52 y.o. male here as a referral from Dr. Milus [name] for yearly CPAP compliance.  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 He was diagnosed with obstructive sleep apnea and titrated to CPAP CPAP at 10 cm water pressure, with a 2 cm water relief pressure I chose he has used a fullface mask and reports that the difference of a night with or without use of CPAP is very evident to him. CPAP has restored his ability to sleep longer Depo and has decreased his excessive daytime sleepiness 2 or 3 points or. The patient also has lost significant weight and I initially saw him the date of his sleep study was 08-14-2009 and at the time the patient presented with a BMI of 38.9 his current BMI is 27.9 [name] 20/30 and confirmed the effectiveness of CPAP with a residual AHI of 1.0. The patient will bring the memory chip from the CPAP machine by here later today  To allow to add  this year's  data to this report. Review of Systems: Out of a complete 14 system review, the patient complains of only the following symptoms, and all other reviewed systems are negative. The patient denies excessive daytime sleepiness, snoring, nocturia, dry mouth. Significant weight loss over the last 2 years, which may by itself have been the best treatment for the patient's sleep apnea.  History   Social History  . Marital Status: Married    Spouse Name: N/A    Number of  Children: 0  . Years of Education: college   Occupational History  . pharmacist     [employer]   Social History Main Topics  . Smoking status: Never Smoker   . Smokeless tobacco: Not on file  . Alcohol Use: No  . Drug Use: No  . Sexually Active: Not on file   Other Topics Concern  . 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   resides alone in a private home, drinks 1 cup of caffeinated beverages a day.          Family History  Problem Relation Age of Onset  . Cancer Father   . Hypertension Father   . Hyperlipidemia Father   . Glaucoma Paternal Aunt   . Stroke Mother   . Dementia Mother   . Anemia Mother     severe  . Cancer Mother     colon  . Hyperlipidemia Sister   . Hypertension Sister   . Headache Sister   . Hypertension Brother   . Hyperlipidemia Brother     Past Medical History  Diagnosis Date  . GERD (gastroesophageal reflux disease)   . Hypertension   .  Hyperlipidemia   . Allergy   . Anxiety   . OSA on CPAP   . Glaucoma     Past Surgical History  Procedure Laterality Date  . Shoulder arthroscopy Right 2009    rotator cuff    Current Outpatient Prescriptions  Medication Sig Dispense Refill  . cyclobenzaprine (FLEXERIL) 10 MG tablet Take 1 tablet (10 mg total) by mouth as needed.  90 tablet  3  . fluticasone (FLONASE) 50 MCG/ACT nasal spray Place 2 sprays into the nose daily.  16 g  11  . latanoprost (XALATAN) 0.005 % ophthalmic solution Place 1 drop into both eyes at bedtime.      . Multiple Vitamin (MULTIVITAMIN) capsule Take 1 capsule by mouth daily.      . pravastatin (PRAVACHOL) 40 MG tablet Take 1 tablet (40 mg total) by mouth daily.  90 tablet  1  . SUMAtriptan (IMITREX) 100 MG tablet Take 1 tablet (100 mg total) by mouth every 2  (two) hours as needed.  10 tablet  11  . testosterone (ANDROGEL) 50 MG/5GM GEL Place 5 g onto the skin daily.  90 Package  1  . verapamil (COVERA HS) 240 MG (CO) 24 hr tablet Take 1 tablet (240 mg total) by mouth daily.  90 tablet  3  . hydrocortisone valerate ointment (WEST-CORT) 0.2 %       . ketoconazole (NIZORAL) 2 % cream       . tadalafil (CIALIS) 20 MG tablet Take 1 tablet (20 mg total) by mouth as needed for erectile dysfunction.  12 tablet  3   No current facility-administered medications for this visit.    Allergies as of 03/19/2013  . (No Known Allergies)    Vitals: BP 127/87  Pulse 69  Ht 5\' 7"  (1.702 m)  Wt 178 lb (80.74 kg)  BMI 27.87 kg/m2 Last Weight:  Wt Readings from Last 1 Encounters:  03/19/13 178 lb (80.74 kg)   Last Height:   Ht Readings from Last 1 Encounters:  03/19/13 5\' 7"  (1.702 m)   Vision Screening:  Vitals Physical exam:  General: The patient is awake, alert and appears not in acute distress. The patient is well groomed. Head: Normocephalic, atraumatic. Neck is supple. Mallampati 1 , neck circumference:15.5  Inches .  Cardiovascular:  Regular rate and rhythm, without  murmurs or carotid bruit, and without distended neck veins. Respiratory: Lungs are clear to auscultation. Skin:  Without evidence of edema, or rash Trunk: BMI  28.9  and patient  has normal posture.  Neurologic exam : The patient is awake and alert, oriented to place and time.  Memory subjective  described as intact. There is a normal attention span & concentration ability. Speech is fluent without  dysarthria, dysphonia or aphasia. Mood and affect are appropriate.  Cranial nerves: Pupils are equal and briskly reactive to light. Funduscopic exam without  evidence of pallor or edema. Extraocular movements  in vertical and horizontal planes intact and without nystagmus. Visual fields by finger perimetry are intact. Hearing to finger rub intact.  Facial sensation intact to fine touch.  Facial motor strength is symmetric and tongue and uvula move midline.  Motor exam:   Normal tone and normal muscle bulk and symmetric normal strength in all extremities.  Sensory:  Fine touch, pinprick and vibration were tested in all extremities. Proprioception is tested in the upper extremities only. This was  normal.  Coordination: Rapid alternating movements in the fingers/hands is tested and normal. Finger-to-nose maneuver tested  and normal without evidence of ataxia, dysmetria or tremor.  Gait and station: Patient walks without assistive device and is able and assisted stool climb up to the exam table. Strength within normal limits. Stance is stable and normal.  Deep tendon reflexes: in the  upper and lower extremities are symmetric and intact.   Assessment:  After physical and neurologic examination, review of laboratory studies, imaging, neurophysiology testing and pre-existing records, assessment will be reviewed on the problem list.  Plan:  Treatment plan and additional workup will be reviewed under Problem List.   I will need to review the most recent download data later today. At the time this visit congruence there is no evidence of any change and pressure if necessary or that the tear such as Marvis Moeller took being up to be changed. The patient prefers a full face mask.   Addendum: The patient presented with his machine and mask at 2. 40  PM today. The detail between February  2 and March 12 2013 show a residual AHI of 1.0, average use of 4 hours and 20 minutes nightly E. Laurie and a septic pressure of 10 cm water with an EPR of 2 cm water. The patient is using the  CPAP machine S 9.FFM. Sanaia Jasso, MD

## 2013-03-19 NOTE — Patient Instructions (Addendum)
Sleep Apnea  Sleep apnea is a sleep disorder characterized by abnormal pauses in breathing while you sleep. When your breathing pauses, the level of oxygen in your blood decreases. This causes you to move out of deep sleep and into light sleep. As a result, your quality of sleep is poor, and the system that carries your blood throughout your body (cardiovascular system) experiences stress. If sleep apnea remains untreated, the following conditions can develop:  High blood pressure (hypertension).  Coronary artery disease.  Inability to achieve or maintain an erection (impotence).  Impairment of your thought process (cognitive dysfunction). There are three types of sleep apnea: 1. Obstructive sleep apnea Pauses in breathing during sleep because of a blocked airway. 2. Central sleep apnea Pauses in breathing during sleep because the area of the brain that controls your breathing does not send the correct signals to the muscles that control breathing. 3. Mixed sleep apnea A combination of both obstructive and central sleep apnea. RISK FACTORS The following risk factors can increase your risk of developing sleep apnea:  Being overweight.  Smoking.  Having narrow passages in your nose and throat.  Being of older age.  Being male.  Alcohol use.  Sedative and tranquilizer use.  Ethnicity. Among individuals younger than 35 years, African Americans are at increased risk of sleep apnea. SYMPTOMS   Difficulty staying asleep.  Daytime sleepiness and fatigue.  Loss of energy.  Irritability.  Loud, heavy snoring.  Morning headaches.  Trouble concentrating.  Forgetfulness.  Decreased interest in sex. DIAGNOSIS  In order to diagnose sleep apnea, your caregiver will perform a physical examination. Your caregiver may suggest that you take a home sleep test. Your caregiver may also recommend that you spend the night in a sleep lab. In the sleep lab, several monitors record  information about your heart, lungs, and brain while you sleep. Your leg and arm movements and blood oxygen level are also recorded. TREATMENT The following actions may help to resolve mild sleep apnea:  Sleeping on your side.   Using a decongestant if you have nasal congestion.   Avoiding the use of depressants, including alcohol, sedatives, and narcotics.   Losing weight and modifying your diet if you are overweight. There also are devices and treatments to help open your airway:  Oral appliances. These are custom-made mouthpieces that shift your lower jaw forward and slightly open your bite. This opens your airway.  Devices that create positive airway pressure. This positive pressure "splints" your airway open to help you breathe better during sleep. The following devices create positive airway pressure:  Continuous positive airway pressure (CPAP) device. The CPAP device creates a continuous level of air pressure with an air pump. The air is delivered to your airway through a mask while you sleep. This continuous pressure keeps your airway open.  Nasal expiratory positive airway pressure (EPAP) device. The EPAP device creates positive air pressure as you exhale. The device consists of single-use valves, which are inserted into each nostril and held in place by adhesive. The valves create very little resistance when you inhale but create much more resistance when you exhale. That increased resistance creates the positive airway pressure. This positive pressure while you exhale keeps your airway open, making it easier to breath when you inhale again.  Bilevel positive airway pressure (BPAP) device. The BPAP device is used mainly in patients with central sleep apnea. This device is similar to the CPAP device because it also uses an air pump to deliver   continuous air pressure through a mask. However, with the BPAP machine, the pressure is set at two different levels. The pressure when you  exhale is lower than the pressure when you inhale.  Surgery. Typically, surgery is only done if you cannot comply with less invasive treatments or if the less invasive treatments do not improve your condition. Surgery involves removing excess tissue in your airway to create a wider passage way. Document Released: 10/22/2002 Document Revised: 05/02/2012 Document Reviewed: 03/09/2012 Exit[employer] Patient Information 2013 [employer].  

## 2013-03-23 ENCOUNTER — Other Ambulatory Visit: Payer: Self-pay | Admitting: Neurology

## 2013-03-23 DIAGNOSIS — G4733 Obstructive sleep apnea (adult) (pediatric): Secondary | ICD-10-CM

## 2013-03-29 ENCOUNTER — Other Ambulatory Visit: Payer: Self-pay | Admitting: Neurology

## 2013-03-29 DIAGNOSIS — G4733 Obstructive sleep apnea (adult) (pediatric): Secondary | ICD-10-CM

## 2013-06-02 ENCOUNTER — Other Ambulatory Visit: Payer: Self-pay | Admitting: Family Medicine

## 2013-08-31 ENCOUNTER — Other Ambulatory Visit: Payer: Self-pay | Admitting: Physician Assistant

## 2013-10-05 ENCOUNTER — Other Ambulatory Visit: Payer: Self-pay | Admitting: Ophthalmology

## 2013-10-05 NOTE — H&P (Signed)
Patient Record  [name], [name] Patient Number:  [id number] Date of Birth:  January 30, 1961 Age:  52 years old    Gender:  Male Date of Evaluation:  October 05, 2013  Chief Complaint:   The patient is referred for Cataract evaluation OS. He has Glaucoma OU. He has had gradual decreased vision OS. Hit was hit OS as a child with a baseball in his left eye.  He does complain of visual compromise. He does note most when he is reading, viewing sports. 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 History of Present Illness:   52 yo male for follow up glaucoma.  On Latanoprost.  No pain or discomfort.   No pus or mucus. Has noticed a difference in his ability to do his work.  Presents for evaluation.( Reviewed by Doctor: GG) Past History:  Allergies:  None., Active Medications:   Other Medications:  Flexeril (cyclobenzaprine) tablet 10 mg 1 as needed, Imitrex (sumatriptan succinate) tablet 100 mg as needed, Zetia (ezetimibe) tablet 10 mg 1 tablet by mouth once a day as directed tablet, verapamil (verapamil) tablet extended release 240 mg 1 tablet by mouth once a day as directed tablet Birth History:  none Past Ocular History:  none Past Medical History:   Hypertension, Elevated Cholesterol Past Surgical History:   rotator cuff  Family History:  no amblyopia, no blindness, + cataracts (father), no crossed eyes, no diabetic retinopathy, no glaucoma, no macular degeneration, no retinal detachment, + cancer (mother, father), no diabetes, no heart disease, + high blood pressure (mother, father), + stroke (mother) Social History:   Smoking Status: never smoker  Alcohol:  none   Driving status:  driving Marital status:  single Review of Systems:   Eyes: + decreased vision, + blurred vision  Cardiovascular:  + high blood pressure, + high cholesterol  Allergic/Immunologic: + seasonal allergies  All other systems are negative.  Examination:  Visual Acuity:     Distance VA cc:  OD: 20/25    OS: 20/50 IOP:  OD:  12     OS:  16    @ 08:59AM (Goldmann applanation) Manifest Refraction:    Sphere    Cyl Axis       VA         Add       VA Prism Base R:  -0.25  -1.00   15    20/20       +2.00                      L:  -1.00  -1.00  150    20/60       +2.00                       comments: 09/03/13  Confrontation visual field: OU:  Normal  Motility: OU:  Normal  Pupils:  OU:  Shape, size, direct and consensual reaction normal  Adnexa:  Preauricular LN, lacrimal drainage, lacrimal glands, orbit normal  Eyelids: Eyelids:  normal Conjunctiva:  OU:  bulbar, palpebral normal  Cornea:  OU:  epithelium, stroma, endothelium, tear film normal  Anterior Chamber:  OU:  depth normal, no cell, no flare  Iris:  OU:  normal Gonioscopy:   OU: 3+ open 360 degrees Dilation:  OU: Tropicamide 1%/ N 2.5% @ 02:31PM  Lens:  OD:  clarity, anterior/posterior capsule, cortex, nucleus normal, 1+ nuclear sclerotic cataract OS:  3+ dense   central nuclear sclerotic cataract  Vitreous: OU:  PVD  Optic Disc: OD:  cupping: 0.4   OS:  cupping: 0.6  Macula: OU:  normal   Vessels:  OU:  normal  Periphery: OU:  normal A Scan / IOLMaster:  AScan predicts + 19.50 PC IOL for implant OS  Orientation to person, place and time:  Normal  Mood and affect:  Normal  Impression:  366.16  Nuclear Sclerotic Cataract OS>OD 365.11  Primary Open Angle Glaucoma OU 365.72  Glaucoma, moderate  379.21  Vitreous Degeneration OU  Plan/Treatment:  Cataract: We discussed the natural history of Cataracts with illustrations. We discussed the related symptoms , visual significance and when we intervene with surgery. We discussed the surgical techniques used, risks and benefits of surgery.  His Cataract OS is visually significant OS. The dense central lens nucleus causes significnat visual compromise under differnt light when driving. I have recommended Phaco IOL OS.  He indicated understanding our  discussion and felt that his questions had been answered to his satisfaction.  He indicates that he desires to proceed with the recommended treatment/care plan.   Medications:   Given e-prescription for: latanoprost (latanoprost) Drops 0.005% 1 drop into both eyes at bedtime, 3 ml, Refills:12, Substitution Permitted:y [facility] [phone]   Patient Instructions: Call immediately if experience decreased vision, halos, red eye, or pain. Continue current management. If there are problems with your glasses please call us and let us know so that I can check them for you. Return to clinic:  Dec 18th, 4:00PM for post-operative follow-up  Schedule:  Phacoemulsification, Posterior Chamber Intraocular Lens OS x    (electronically signed)  Adea Geisel, MD   

## 2013-10-24 ENCOUNTER — Encounter (HOSPITAL_COMMUNITY): Payer: Self-pay | Admitting: Pharmacy Technician

## 2013-10-24 ENCOUNTER — Encounter (HOSPITAL_COMMUNITY): Payer: Self-pay

## 2013-10-24 ENCOUNTER — Ambulatory Visit (HOSPITAL_COMMUNITY)
Admission: RE | Admit: 2013-10-24 | Discharge: 2013-10-24 | Disposition: A | Discharge status: Home / Self Care | Payer: BC Managed Care – PPO | Source: Ambulatory Visit | Attending: Anesthesiology | Admitting: Anesthesiology

## 2013-10-24 ENCOUNTER — Encounter (HOSPITAL_COMMUNITY)
Admission: RE | Admit: 2013-10-24 | Discharge: 2013-10-24 | Disposition: A | Discharge status: Home / Self Care | Payer: BC Managed Care – PPO | Source: Ambulatory Visit | Attending: Ophthalmology | Admitting: Ophthalmology

## 2013-10-24 DIAGNOSIS — Z01818 Encounter for other preprocedural examination: Secondary | ICD-10-CM | POA: Insufficient documentation

## 2013-10-24 DIAGNOSIS — I10 Essential (primary) hypertension: Secondary | ICD-10-CM | POA: Insufficient documentation

## 2013-10-24 DIAGNOSIS — Z01812 Encounter for preprocedural laboratory examination: Secondary | ICD-10-CM | POA: Insufficient documentation

## 2013-10-24 DIAGNOSIS — Z01810 Encounter for preprocedural cardiovascular examination: Secondary | ICD-10-CM | POA: Insufficient documentation

## 2013-10-24 DIAGNOSIS — G4730 Sleep apnea, unspecified: Secondary | ICD-10-CM | POA: Insufficient documentation

## 2013-10-24 HISTORY — DX: Headache: R51

## 2013-10-24 LAB — CBC
HCT: 43 % (ref 39.0–52.0)
MCHC: 34.4 g/dL (ref 30.0–36.0)
Platelets: 210 10*3/uL (ref 150–400)
RDW: 14.1 % (ref 11.5–15.5)
WBC: 5 10*3/uL (ref 4.0–10.5)

## 2013-10-24 LAB — BASIC METABOLIC PANEL
BUN: 9 mg/dL (ref 6–23)
Chloride: 103 mEq/L (ref 96–112)
Creatinine, Ser: 1.39 mg/dL — ABNORMAL HIGH (ref 0.50–1.35)
GFR calc Af Amer: 66 mL/min — ABNORMAL LOW (ref >90)
GFR calc non Af Amer: 57 mL/min — ABNORMAL LOW (ref >90)
Sodium: 138 mEq/L (ref 135–145)

## 2013-10-24 NOTE — Pre-Procedure Instructions (Signed)
William [name]  10/24/2013   Your procedure is scheduled on:  Wednesday December 17 th 0830 AM  Report to [name] Main Entrance "A"at 0630 AM.  Call this number if you have problems the morning of surgery: [phone]   Remember:   Do not eat food or drink liquids after midnight.   Take these medicines the morning of surgery with A SIP OF WATER: Imitrex if needed for headache   Do not wear jewelry, make-up or nail polish.  Do not wear lotions, or powders. You may wear deodorant.             Men may shave face and neck.  Do not bring valuables to the hospital.  [name] is not responsible for any belongings or valuables.               Contacts, dentures or bridgework may not be worn into surgery.  Leave suitcase in the car. After surgery it may be brought to your room.  For patients admitted to the hospital, discharge time is determined by your treatment team.               Patients discharged the day of surgery will not be allowed to drive home.    Special Instructions: Shower using CHG 2 nights before surgery and the night before surgery.  If you shower the day of surgery use CHG.  Use special wash - you have one bottle of CHG for all showers.  You should use approximately 1/3 of the bottle for each shower.   Please read over the following fact sheets that you were given: Pain Booklet, Coughing and Deep Breathing and Surgical Site Infection Prevention

## 2013-10-26 NOTE — Progress Notes (Signed)
Left message with pt. To call back and confirm place where sleep study was done.

## 2013-10-30 MED ORDER — PHENYLEPHRINE HCL 2.5 % OP SOLN
1.0000 [drp] | OPHTHALMIC | Status: AC | PRN
  Administered 2013-10-31 (×3): 1 [drp] via OPHTHALMIC
  Filled 2013-10-30: qty 15

## 2013-10-30 MED ORDER — TETRACAINE HCL 0.5 % OP SOLN
2.0000 [drp] | OPHTHALMIC | Status: AC
  Administered 2013-10-31: 2 [drp] via OPHTHALMIC
  Filled 2013-10-30: qty 2

## 2013-10-30 MED ORDER — PREDNISOLONE ACETATE 1 % OP SUSP
1.0000 [drp] | OPHTHALMIC | Status: AC
  Administered 2013-10-31: 1 [drp] via OPHTHALMIC
  Filled 2013-10-30: qty 5

## 2013-10-30 MED ORDER — CYCLOPENTOLATE HCL 1 % OP SOLN
1.0000 [drp] | OPHTHALMIC | Status: AC | PRN
  Administered 2013-10-31 (×3): 1 [drp] via OPHTHALMIC
  Filled 2013-10-30: qty 2

## 2013-10-30 MED ORDER — GATIFLOXACIN 0.5 % OP SOLN
1.0000 [drp] | OPHTHALMIC | Status: AC | PRN
  Administered 2013-10-31 (×3): 1 [drp] via OPHTHALMIC
  Filled 2013-10-30: qty 2.5

## 2013-10-30 NOTE — Progress Notes (Signed)
Pt notified of time change;to arrive at 0800 

## 2013-10-31 ENCOUNTER — Encounter (HOSPITAL_COMMUNITY): Payer: Self-pay | Admitting: *Deleted

## 2013-10-31 ENCOUNTER — Ambulatory Visit (HOSPITAL_COMMUNITY)
Admission: RE | Admit: 2013-10-31 | Discharge: 2013-10-31 | Disposition: A | Discharge status: Home / Self Care | Payer: BC Managed Care – PPO | Source: Ambulatory Visit | Attending: Ophthalmology | Admitting: Ophthalmology

## 2013-10-31 ENCOUNTER — Ambulatory Visit (HOSPITAL_COMMUNITY): Payer: BC Managed Care – PPO | Admitting: Anesthesiology

## 2013-10-31 ENCOUNTER — Encounter (HOSPITAL_COMMUNITY)
Admission: RE | Disposition: A | Discharge status: Home / Self Care | Payer: Self-pay | Source: Ambulatory Visit | Attending: Ophthalmology

## 2013-10-31 ENCOUNTER — Encounter (HOSPITAL_COMMUNITY): Payer: BC Managed Care – PPO | Admitting: Anesthesiology

## 2013-10-31 DIAGNOSIS — I10 Essential (primary) hypertension: Secondary | ICD-10-CM | POA: Insufficient documentation

## 2013-10-31 DIAGNOSIS — K219 Gastro-esophageal reflux disease without esophagitis: Secondary | ICD-10-CM | POA: Insufficient documentation

## 2013-10-31 DIAGNOSIS — H43819 Vitreous degeneration, unspecified eye: Secondary | ICD-10-CM | POA: Insufficient documentation

## 2013-10-31 DIAGNOSIS — Z79899 Other long term (current) drug therapy: Secondary | ICD-10-CM | POA: Insufficient documentation

## 2013-10-31 DIAGNOSIS — E7800 Pure hypercholesterolemia, unspecified: Secondary | ICD-10-CM | POA: Insufficient documentation

## 2013-10-31 DIAGNOSIS — F411 Generalized anxiety disorder: Secondary | ICD-10-CM | POA: Insufficient documentation

## 2013-10-31 DIAGNOSIS — J301 Allergic rhinitis due to pollen: Secondary | ICD-10-CM | POA: Insufficient documentation

## 2013-10-31 DIAGNOSIS — H4010X0 Unspecified open-angle glaucoma, stage unspecified: Secondary | ICD-10-CM | POA: Insufficient documentation

## 2013-10-31 DIAGNOSIS — H2589 Other age-related cataract: Secondary | ICD-10-CM | POA: Insufficient documentation

## 2013-10-31 DIAGNOSIS — H409 Unspecified glaucoma: Secondary | ICD-10-CM | POA: Insufficient documentation

## 2013-10-31 HISTORY — PX: CATARACT EXTRACTION W/PHACO: SHX586

## 2013-10-31 SURGERY — PHACOEMULSIFICATION, CATARACT, WITH IOL INSERTION
Anesthesia: Monitor Anesthesia Care | Site: Eye | Laterality: Left

## 2013-10-31 MED ORDER — TRAMADOL HCL 50 MG PO TABS: 50.0000 mg | ORAL_TABLET | ORAL | Status: DC | PRN

## 2013-10-31 MED ORDER — NA CHONDROIT SULF-NA HYALURON 40-30 MG/ML IO SOLN
INTRAOCULAR | Status: AC
  Filled 2013-10-31: qty 0.5

## 2013-10-31 MED ORDER — SODIUM HYALURONATE 10 MG/ML IO SOLN
INTRAOCULAR | Status: AC
  Filled 2013-10-31: qty 0.85

## 2013-10-31 MED ORDER — ONDANSETRON HCL 4 MG/2ML IJ SOLN: 4.0000 mg | Freq: Once | INTRAMUSCULAR | Status: DC | PRN

## 2013-10-31 MED ORDER — ONDANSETRON HCL 4 MG/2ML IJ SOLN
INTRAMUSCULAR | Status: DC | PRN
  Administered 2013-10-31: 4 mg via INTRAVENOUS

## 2013-10-31 MED ORDER — HYPROMELLOSE (GONIOSCOPIC) 2.5 % OP SOLN
OPHTHALMIC | Status: AC
  Filled 2013-10-31: qty 15

## 2013-10-31 MED ORDER — NA CHONDROIT SULF-NA HYALURON 40-30 MG/ML IO SOLN
INTRAOCULAR | Status: DC | PRN
  Administered 2013-10-31: 0.5 mL via INTRAOCULAR

## 2013-10-31 MED ORDER — CEFAZOLIN SUBCONJUNCTIVAL INJECTION 100 MG/0.5 ML
200.0000 mg | INJECTION | SUBCONJUNCTIVAL | Status: DC
  Filled 2013-10-31: qty 1

## 2013-10-31 MED ORDER — LIDOCAINE HCL (CARDIAC) 20 MG/ML IV SOLN
INTRAVENOUS | Status: DC | PRN
  Administered 2013-10-31: 40 mg via INTRAVENOUS

## 2013-10-31 MED ORDER — HYPROMELLOSE (GONIOSCOPIC) 2.5 % OP SOLN
OPHTHALMIC | Status: DC | PRN
  Administered 2013-10-31: 2 [drp] via OPHTHALMIC

## 2013-10-31 MED ORDER — TETRACAINE HCL 0.5 % OP SOLN
OPHTHALMIC | Status: AC
  Filled 2013-10-31: qty 2

## 2013-10-31 MED ORDER — TRIAMCINOLONE ACETONIDE 40 MG/ML IJ SUSP
INTRAMUSCULAR | Status: AC
  Filled 2013-10-31: qty 5

## 2013-10-31 MED ORDER — LIDOCAINE HCL 2 % IJ SOLN
INTRAMUSCULAR | Status: DC | PRN
  Administered 2013-10-31: 11:00:00 via RETROBULBAR

## 2013-10-31 MED ORDER — BUPIVACAINE HCL (PF) 0.75 % IJ SOLN
INTRAMUSCULAR | Status: AC
  Filled 2013-10-31: qty 10

## 2013-10-31 MED ORDER — HYDROMORPHONE HCL PF 1 MG/ML IJ SOLN: 0.2500 mg | INTRAMUSCULAR | Status: DC | PRN

## 2013-10-31 MED ORDER — LIDOCAINE HCL 2 % IJ SOLN
INTRAMUSCULAR | Status: AC
  Filled 2013-10-31: qty 20

## 2013-10-31 MED ORDER — BACITRACIN-POLYMYXIN B 500-10000 UNIT/GM OP OINT
TOPICAL_OINTMENT | OPHTHALMIC | Status: AC
  Filled 2013-10-31: qty 3.5

## 2013-10-31 MED ORDER — SODIUM CHLORIDE 0.9 % IV SOLN: INTRAVENOUS | Status: DC

## 2013-10-31 MED ORDER — SODIUM CHLORIDE 0.9 % IV SOLN
INTRAVENOUS | Status: DC
  Administered 2013-10-31: 08:00:00 via INTRAVENOUS

## 2013-10-31 MED ORDER — PROVISC 10 MG/ML IO SOLN
INTRAOCULAR | Status: DC | PRN
  Administered 2013-10-31: 0.85 mL via INTRAOCULAR

## 2013-10-31 MED ORDER — CEFAZOLIN SUBCONJUNCTIVAL INJECTION 100 MG/0.5 ML
INJECTION | SUBCONJUNCTIVAL | Status: DC | PRN
  Administered 2013-10-31: 200 mg via SUBCONJUNCTIVAL

## 2013-10-31 MED ORDER — PROPOFOL 10 MG/ML IV BOLUS
INTRAVENOUS | Status: DC | PRN
  Administered 2013-10-31: 100 mg via INTRAVENOUS

## 2013-10-31 MED ORDER — BSS IO SOLN
INTRAOCULAR | Status: AC
  Filled 2013-10-31: qty 500

## 2013-10-31 MED ORDER — DEXAMETHASONE SODIUM PHOSPHATE 10 MG/ML IJ SOLN
INTRAMUSCULAR | Status: AC
  Filled 2013-10-31: qty 1

## 2013-10-31 MED ORDER — SODIUM CHLORIDE 0.9 % IV SOLN
INTRAVENOUS | Status: DC | PRN
  Administered 2013-10-31: 10:00:00 via INTRAVENOUS

## 2013-10-31 MED ORDER — DEXAMETHASONE SODIUM PHOSPHATE 10 MG/ML IJ SOLN
INTRAMUSCULAR | Status: DC | PRN
  Administered 2013-10-31: 10 mg

## 2013-10-31 MED ORDER — ACETAZOLAMIDE SODIUM 500 MG IJ SOLR
INTRAMUSCULAR | Status: AC
  Filled 2013-10-31: qty 500

## 2013-10-31 MED ORDER — EPINEPHRINE HCL 1 MG/ML IJ SOLN
INTRAMUSCULAR | Status: AC
  Filled 2013-10-31: qty 1

## 2013-10-31 MED ORDER — BSS IO SOLN
INTRAOCULAR | Status: DC | PRN
  Administered 2013-10-31: 10:00:00

## 2013-10-31 SURGICAL SUPPLY — 55 items
APL SRG 3 HI ABS STRL LF PLS (MISCELLANEOUS) ×1
APPLICATOR COTTON TIP 6IN STRL (MISCELLANEOUS) ×2 IMPLANT
APPLICATOR DR MATTHEWS STRL (MISCELLANEOUS) ×2 IMPLANT
BLADE KERATOME 2.75 (BLADE) ×2 IMPLANT
BLADE STAB KNIFE 15DEG (BLADE) IMPLANT
COVER MAYO STAND STRL (DRAPES) ×2 IMPLANT
DRAPE OPHTHALMIC 77X100 STRL (CUSTOM PROCEDURE TRAY) ×2 IMPLANT
DRAPE POUCH INSTRU U-SHP 10X18 (DRAPES) ×2 IMPLANT
DRSG TEGADERM 4X4.75 (GAUZE/BANDAGES/DRESSINGS) ×2 IMPLANT
FILTER BLUE MILLIPORE (MISCELLANEOUS) IMPLANT
GLOVE SS BIOGEL STRL SZ 6.5 (GLOVE) ×1 IMPLANT
GLOVE SUPERSENSE BIOGEL SZ 6.5 (GLOVE) ×1
GLOVE SURG SS PI 6.5 STRL IVOR (GLOVE) ×1 IMPLANT
GLOVE SURG SS PI 7.0 STRL IVOR (GLOVE) ×1 IMPLANT
GOWN SRG XL XLNG 56XLVL 4 (GOWN DISPOSABLE) ×1 IMPLANT
GOWN STRL NON-REIN LRG LVL3 (GOWN DISPOSABLE) ×2 IMPLANT
GOWN STRL NON-REIN XL XLG LVL4 (GOWN DISPOSABLE) ×2
KIT BASIN OR (CUSTOM PROCEDURE TRAY) ×2 IMPLANT
KIT ROOM TURNOVER OR (KITS) ×2 IMPLANT
KNIFE GRIESHABER SHARP 2.5MM (MISCELLANEOUS) ×2 IMPLANT
LENS IOL ACRYSOF MP POST 19.5 (Intraocular Lens) ×1 IMPLANT
MASK EYE SHIELD (GAUZE/BANDAGES/DRESSINGS) ×1 IMPLANT
NDL 25GX 5/8IN NON SAFETY (NEEDLE) ×2 IMPLANT
NDL FILTER BLUNT 18X1 1/2 (NEEDLE) IMPLANT
NDL HYPO 30X.5 LL (NEEDLE) ×2 IMPLANT
NEEDLE 22X1 1/2 (OR ONLY) (NEEDLE) IMPLANT
NEEDLE 25GX 5/8IN NON SAFETY (NEEDLE) ×4 IMPLANT
NEEDLE FILTER BLUNT 18X 1/2SAF (NEEDLE)
NEEDLE FILTER BLUNT 18X1 1/2 (NEEDLE) IMPLANT
NEEDLE HYPO 30X.5 LL (NEEDLE) ×4 IMPLANT
NS IRRIG 1000ML POUR BTL (IV SOLUTION) ×2 IMPLANT
PACK CATARACT CUSTOM (CUSTOM PROCEDURE TRAY) ×2 IMPLANT
PAD ARMBOARD 7.5X6 YLW CONV (MISCELLANEOUS) ×2 IMPLANT
PAD EYE OVAL STERILE LF (GAUZE/BANDAGES/DRESSINGS) ×1 IMPLANT
PAK PIK CVS CATARACT (OPHTHALMIC) ×2 IMPLANT
PROBE ANTERIOR 20G W/INFUS NDL (MISCELLANEOUS) IMPLANT
SHUTTLE MONARCH TYPE A (NEEDLE) ×2 IMPLANT
SPEAR EYE SURG WECK-CEL (MISCELLANEOUS) IMPLANT
SUT ETHILON 10-0 CS-B-6CS-B-6 (SUTURE)
SUT ETHILON 5 0 P 3 18 (SUTURE)
SUT ETHILON 9 0 TG140 8 (SUTURE) IMPLANT
SUT NYLON ETHILON 5-0 P-3 1X18 (SUTURE) IMPLANT
SUT PLAIN 6 0 TG1408 (SUTURE) IMPLANT
SUT POLY NON ABSORB 10-0 8 STR (SUTURE) IMPLANT
SUT VICRYL 6 0 S 29 12 (SUTURE) IMPLANT
SUTURE EHLN 10-0 CS-B-6CS-B-6 (SUTURE) IMPLANT
SYR 20CC LL (SYRINGE) IMPLANT
SYR TB 1ML LUER SLIP (SYRINGE) ×1 IMPLANT
SYRINGE 10CC LL (SYRINGE) IMPLANT
TAPE PAPER MEDFIX 1IN X 10YD (GAUZE/BANDAGES/DRESSINGS) ×1 IMPLANT
TIP ABS 45DEG FLARED 0.9MM (TIP) ×2 IMPLANT
TOWEL OR 17X24 6PK STRL BLUE (TOWEL DISPOSABLE) ×4 IMPLANT
WATER STERILE IRR 1000ML POUR (IV SOLUTION) ×2 IMPLANT
WIPE INSTRUMENT ADHESIVE BACK (MISCELLANEOUS) ×2 IMPLANT
WIPE INSTRUMENT VISIWIPE 73X73 (MISCELLANEOUS) ×2 IMPLANT

## 2013-10-31 NOTE — Transfer of Care (Signed)
Immediate Anesthesia Transfer of Care Note  Patient: William [name]  Procedure(s) Performed: Procedure(s): CATARACT EXTRACTION PHACO AND INTRAOCULAR LENS PLACEMENT (IOC) (Left)  Patient Location: PACU  Anesthesia Type:MAC  Level of Consciousness: awake, alert , oriented and patient cooperative  Airway & Oxygen Therapy: Patient Spontanous Breathing  Post-op Assessment: Report given to PACU RN  Post vital signs: Reviewed and stable  Complications: No apparent anesthesia complications

## 2013-10-31 NOTE — H&P (View-Only) (Signed)
Patient Record  William [name], William [name] Patient Number:  [id number] Date of Birth:  Jul 25, 1961 Age:  52 years old    Gender:  Male Date of Evaluation:  October 05, 2013  Chief Complaint:   The patient is referred for Cataract evaluation OS. He has Glaucoma OU. He has had gradual decreased vision OS. Hit was hit OS as a child with a baseball in his left eye.  He does complain of visual compromise. He does note most when he is reading, viewing sports. 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 History of Present Illness:   52 yo male for follow up glaucoma.  On Latanoprost.  No pain or discomfort.   No pus or mucus. Has noticed a difference in his ability to do his work.  Presents for evaluation.( Reviewed by Doctor: GG) Past History:  Allergies:  None., Active Medications:   Other Medications:  Flexeril (cyclobenzaprine) tablet 10 mg 1 as needed, Imitrex (sumatriptan succinate) tablet 100 mg as needed, Zetia (ezetimibe) tablet 10 mg 1 tablet by mouth once a day as directed tablet, verapamil (verapamil) tablet extended release 240 mg 1 tablet by mouth once a day as directed tablet Birth History:  none Past Ocular History:  none Past Medical History:   Hypertension, Elevated Cholesterol Past Surgical History:   rotator cuff  Family History:  no amblyopia, no blindness, + cataracts (father), no crossed eyes, no diabetic retinopathy, no glaucoma, no macular degeneration, no retinal detachment, + cancer (mother, father), no diabetes, no heart disease, + high blood pressure (mother, father), + stroke (mother) Social History:   Smoking Status: never smoker  Alcohol:  none   Driving status:  driving Marital status:  single Review of Systems:   Eyes: + decreased vision, + blurred vision  Cardiovascular:  + high blood pressure, + high cholesterol  Allergic/Immunologic: + seasonal allergies  All other systems are negative.  Examination:  Visual Acuity:     Distance VA cc:  OD: 20/25    OS: 20/50 IOP:  OD:  12     OS:  16    @ 08:59AM (Goldmann applanation) Manifest Refraction:    Sphere    Cyl Axis       VA         Add       VA Prism Base R:  -0.25  -1.00   15    20/20       +2.00                      L:  -1.00  -1.00  150    20/60       +2.00                       comments: 09/03/13  Confrontation visual field: OU:  Normal  Motility: OU:  Normal  Pupils:  OU:  Shape, size, direct and consensual reaction normal  Adnexa:  Preauricular LN, lacrimal drainage, lacrimal glands, orbit normal  Eyelids: Eyelids:  normal Conjunctiva:  OU:  bulbar, palpebral normal  Cornea:  OU:  epithelium, stroma, endothelium, tear film normal  Anterior Chamber:  OU:  depth normal, no cell, no flare  Iris:  OU:  normal Gonioscopy:   OU: 3+ open 360 degrees Dilation:  OU: Tropicamide 1%/ N 2.5% @ 02:31PM  Lens:  OD:  clarity, anterior/posterior capsule, cortex, nucleus normal, 1+ nuclear sclerotic cataract OS:  3+ dense  central nuclear sclerotic cataract  Vitreous: OU:  PVD  Optic Disc: OD:  cupping: 0.4   OS:  cupping: 0.6  Macula: OU:  normal   Vessels:  OU:  normal  Periphery: OU:  normal A Scan / IOLMaster:  AScan predicts + 19.50 PC IOL for implant OS  Orientation to person, place and time:  Normal  Mood and affect:  Normal  Impression:  366.16  Nuclear Sclerotic Cataract OS>OD 365.11  Primary Open Angle Glaucoma OU 365.72  Glaucoma, moderate  379.21  Vitreous Degeneration OU  Plan/Treatment:  Cataract: We discussed the natural history of Cataracts with illustrations. We discussed the related symptoms , visual significance and when we intervene with surgery. We discussed the surgical techniques used, risks and benefits of surgery.  His Cataract OS is visually significant OS. The dense central lens nucleus causes significnat visual compromise under differnt light when driving. I have recommended Phaco IOL OS.  He indicated understanding our  discussion and felt that his questions had been answered to his satisfaction.  He indicates that he desires to proceed with the recommended treatment/care plan.   Medications:   Given e-prescription for: latanoprost (latanoprost) Drops 0.005% 1 drop into both eyes at bedtime, 3 ml, Refills:12, Substitution Permitted:[facility] [phone]   Patient Instructions: Call immediately if experience decreased vision, halos, red eye, or pain. Continue current management. If there are problems with your glasses please call us and let us know so that I can check them for you. Return to clinic:  Dec 18th, 4:00PM for post-operative follow-up  Schedule:  Phacoemulsification, Posterior Chamber Intraocular Lens OS x    (electronically signed)  Shade Flood, MD

## 2013-10-31 NOTE — Interval H&P Note (Signed)
History and Physical Interval Note:  10/31/2013 8:24 AM  William [name]  has presented today for surgery, with the diagnosis of Nuclerar Sclerotic Cataract Left Eye  The various methods of treatment have been discussed with the patient and family. After consideration of risks, benefits and other options for treatment, the patient has consented to  Procedure(s): CATARACT EXTRACTION PHACO AND INTRAOCULAR LENS PLACEMENT (IOC) (Left) as a surgical intervention .  The patient's history has been reviewed, patient examined, no change in status, stable for surgery.  I have reviewed the patient's chart and labs.  Questions were answered to the patient's satisfaction.    BP 160/105  Pulse 75  Temp(Src) 98.2 F (36.8 C) (Oral)  Resp 20  SpO2 99%  There are no contraindications for planned surgery OS.   William [name], Waynette Buttery

## 2013-10-31 NOTE — Anesthesia Postprocedure Evaluation (Signed)
  Anesthesia Post-op Note  Patient: William [name]  Procedure(s) Performed: Procedure(s): CATARACT EXTRACTION PHACO AND INTRAOCULAR LENS PLACEMENT (IOC) (Left)  Patient Location: PACU  Anesthesia Type:MAC  Level of Consciousness: awake, alert , oriented and patient cooperative  Airway and Oxygen Therapy: Patient Spontanous Breathing  Post-op Pain: none  Post-op Assessment: Post-op Vital signs reviewed, Patient's Cardiovascular Status Stable, Respiratory Function Stable, Patent Airway, No signs of Nausea or vomiting and Pain level controlled  Post-op Vital Signs: stable  Complications: No apparent anesthesia complications

## 2013-10-31 NOTE — Anesthesia Preprocedure Evaluation (Signed)
Anesthesia Evaluation  Patient identified by MRN, date of birth, ID band Patient awake    Reviewed: Allergy & Precautions, H&P , NPO status , Patient's Chart, lab work & pertinent test results  Airway       Dental   Pulmonary sleep apnea ,          Cardiovascular hypertension,     Neuro/Psych  Headaches, Anxiety    GI/Hepatic GERD-  ,  Endo/Other    Renal/GU      Musculoskeletal   Abdominal   Peds  Hematology   Anesthesia Other Findings   Reproductive/Obstetrics                           Anesthesia Physical Anesthesia Plan  ASA: III  Anesthesia Plan: MAC   Post-op Pain Management:    Induction: Intravenous  Airway Management Planned: Simple Face Mask  Additional Equipment:   Intra-op Plan:   Post-operative Plan:   Informed Consent: I have reviewed the patients History and Physical, chart, labs and discussed the procedure including the risks, benefits and alternatives for the proposed anesthesia with the patient or authorized representative who has indicated his/her understanding and acceptance.     Plan Discussed with:   Anesthesia Plan Comments:         Anesthesia Quick Evaluation

## 2013-10-31 NOTE — Addendum Note (Signed)
Addendum created 10/31/13 1154 by Coralee Rud, CRNA   Modules edited: Anesthesia Events

## 2013-10-31 NOTE — Op Note (Signed)
William [name] 10/31/2013 Cataract: Combined, Nuclear  Procedure: Phacoemulsification, Posterior Chamber Intra-ocular Lens Operative Eye:  left eye  Surgeon: Shade Flood Estimated Blood Loss: minimal Specimens for Pathology:  None Complications: none  The patient was prepared and draped in the usual manner for ocular surgery on the left eye. A Cook lid speculum was placed. A peripheral clear corneal incision was made at the surgical limbus centered at the 11:00 meridian. A separate clear corneal stab incision was made with a 15 degree blade at the 2:00 meridian to permit bi-manual technique. Viscoat and  Provisc as an underlying layer next to the capsule was instilled into the anterior chamber through that incision.  A keratome was used to create a self sealing incision entering the anterior chamber at the 11:00 meridian. A capsulorhexis was performed using a bent 25g needle. The lens was hydrodissected and the nucleus was hydrodilineated using a Nichammin cannula. The Chang chopper was inserted and used to rotate the lens to insure adequate lens mobility. The phacoemulsification handpiece was inserted and a combined phaco-chop technique was employed, fracturing the lens into separate sections with subsequent removal with the phaco handpiece.   The I/A cannula was used to remove remaining lens cortex. Provisc was instilled and used to deepen the anterior chamber and posterior capsule bag. The Monarch injector was used to place a folded Acrysof [id number] PC IOL, + 19.50  diopters, into the capsule bag. A Sinskey lens hook was used to dial in the trailing haptic.  The I/A cannula was used to remove the viscoelastic from the anterior chamber. BSS was used to bring IOP to the desired range and the wound was checked to insure it was watertight. Subconjunctival injections of Ancef 100/0.54ml and Dexamethasone 0.5 ml of a 10mg /58ml solution were placed without complication. The lid speculum and drapes were  removed and the patient's eye was patched with Polymixin/Bacitracin ophthalmic ointment. An eye shield was placed and the patient was transferred alert and conversant from the operating room to the post-operative recovery area.   Shade Flood, MD

## 2013-11-01 ENCOUNTER — Encounter (HOSPITAL_COMMUNITY): Payer: Self-pay | Admitting: Ophthalmology

## 2014-02-06 ENCOUNTER — Other Ambulatory Visit: Payer: Self-pay

## 2014-02-13 IMAGING — CR DG CHEST 2V
2 series · 2 of 2 positions shown · non-contrast
Comparison: Chest x-ray of November 04, 2009.

CLINICAL DATA: History of hypertension and sleep apnea,
preoperative study

EXAM:
CHEST  2 VIEW

[w chest pa]
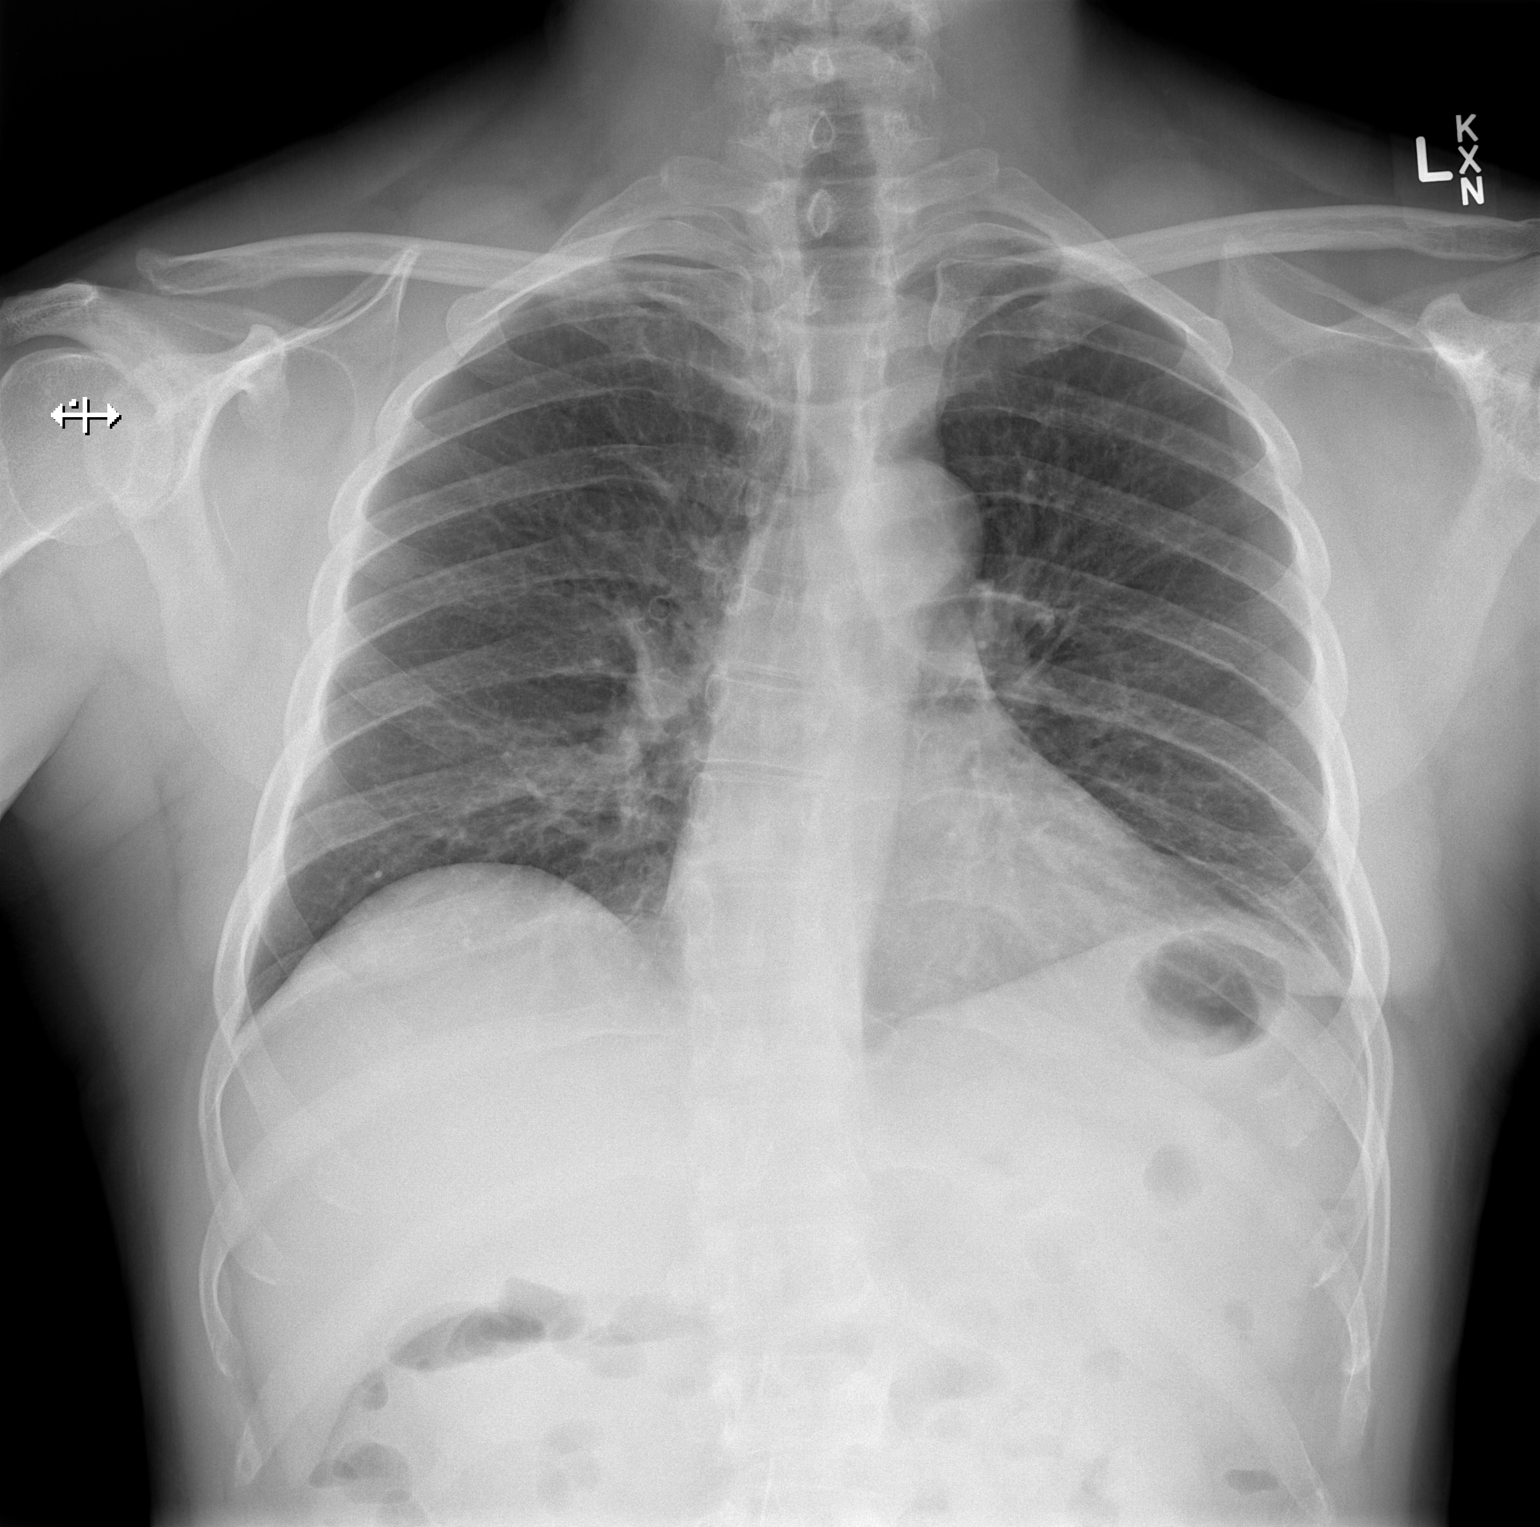

[w chest lat]
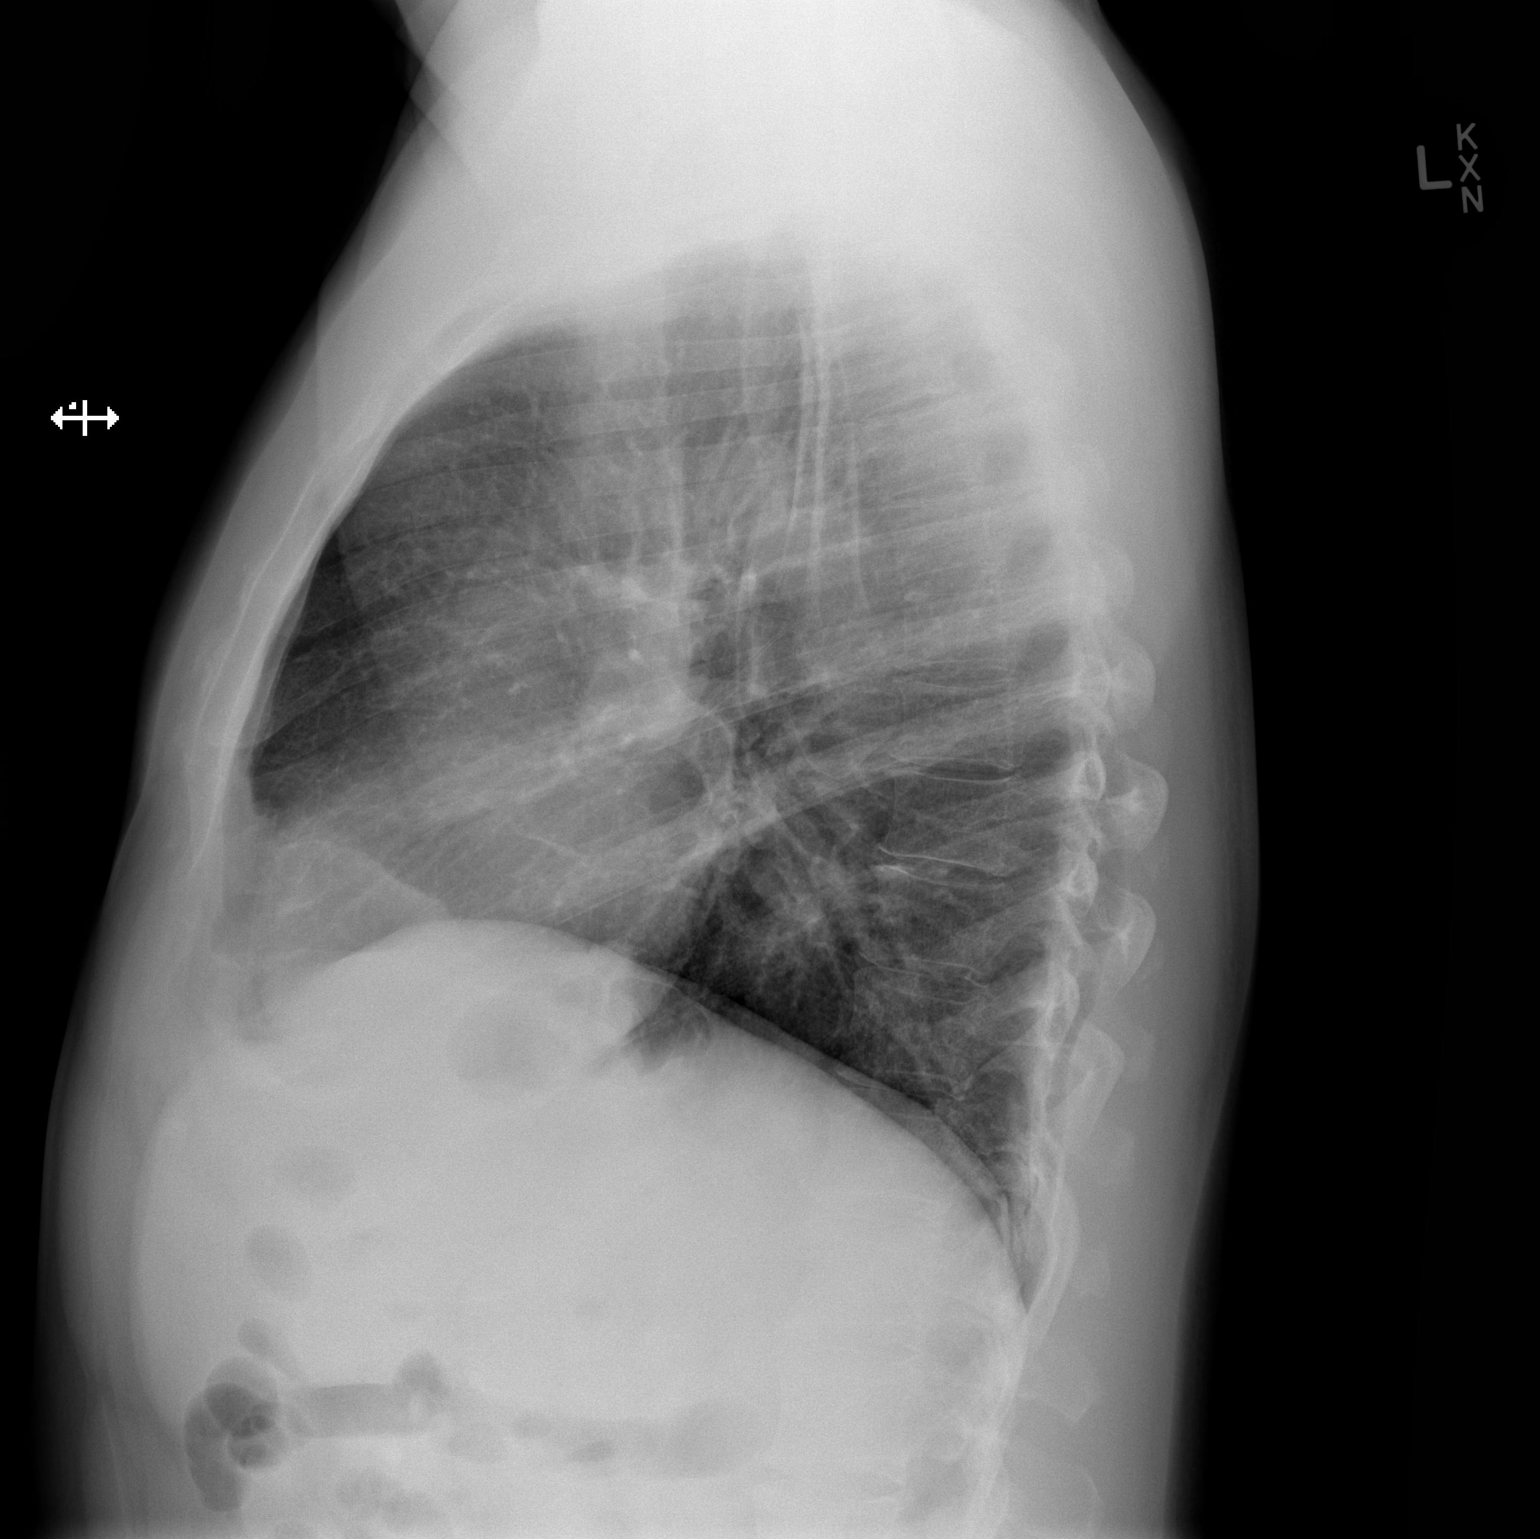

[2 of 2 positions shown; findings below may reference images not displayed]

FINDINGS: The lungs are reasonably well inflated. The interstitial markings
are mildly prominent and these have become more conspicuous since
the previous study. The cardiopericardial silhouette is top-normal
in size. The pulmonary vascularity is not engorged. There is no
pleural effusion or pneumothorax. The mediastinum is normal in
width. There is gentle mid thoracic dextroscoliosis.
IMPRESSION: Mildly increased interstitial markings in both lungs may reflect
mild interstitial edema or fibrotic change. There is no evidence of
pulmonary vascular congestion. There is no alveolar pneumonia.

## 2014-03-19 ENCOUNTER — Ambulatory Visit: Payer: BC Managed Care – PPO | Admitting: Neurology

## 2014-06-26 ENCOUNTER — Telehealth: Payer: Self-pay | Admitting: Neurology

## 2014-06-26 ENCOUNTER — Encounter: Payer: Self-pay | Admitting: Neurology

## 2014-06-26 ENCOUNTER — Ambulatory Visit (INDEPENDENT_AMBULATORY_CARE_PROVIDER_SITE_OTHER): Payer: BC Managed Care – PPO | Admitting: Neurology

## 2014-06-26 VITALS — BP 141/89 | HR 80 | Resp 16 | Ht 67.25 in | Wt 184.0 lb

## 2014-06-26 DIAGNOSIS — G4733 Obstructive sleep apnea (adult) (pediatric): Secondary | ICD-10-CM

## 2014-06-26 DIAGNOSIS — Q120 Congenital cataract: Secondary | ICD-10-CM

## 2014-06-26 DIAGNOSIS — Z9989 Dependence on other enabling machines and devices: Principal | ICD-10-CM

## 2014-06-26 DIAGNOSIS — R634 Abnormal weight loss: Secondary | ICD-10-CM

## 2014-06-26 HISTORY — DX: Congenital cataract: Q12.0

## 2014-06-26 HISTORY — DX: Abnormal weight loss: R63.4

## 2014-06-26 NOTE — Patient Instructions (Signed)

## 2014-06-26 NOTE — Telephone Encounter (Signed)
Pt is unable to afford a sleep study at this time.  He will call at the beginning of the year to schedule.  Will send this notification to Dr. Vickey Hugerohmeier to see if any adjustments to his current CPAP settings are required since he cannot have a HST or an attended sleep study.

## 2014-06-26 NOTE — Progress Notes (Signed)
SLEEP MEDICINE CLINIC   Provider:  Melvyn Novasarmen  Vinie Charity, M D  Referring Provider: Elvina SidleLauenstein, Kurt, MD Primary Care Physician:  Gaye AlkenBARNES,ELIZABETH STEWART, MD  CPAP compliance  HPI:  William [name] is a 53 y.o. male , who  [facility].     William [name] is evaluated for sleep apnea on 08-14-09, at the time patient of Dr. Clydie BraunKaren Richter's. At that time he had a BMI of 38.9 and a documented history of hypertension hypogonadism with testosterone deficiency, migraines obesity and presented with witnessed snoring and apnea as well as hypersomnia and daytime. He was diagnosed with an AHI of 30.2,  in supine sleep his AHI became 73.1 per hour of sleep and during REM sleep it was exacerbated to 47.6 AHI. Oxygen nadir was 81% but only 7.4 minutes of total desaturation time are noted.  The patient was asked to return for a CPAP titration and has used since August 2010 centimeter water pressure he has been [facility], changed later to [facility] as his DME. He has lost weight since his initial study, but recently regained a few pounds on prednisone therapy for neck spasm , cervical DDD.  He has used his machine compliantly since 2010. He brought his memory chip card, it documented an average use of 5 hours and 32 minutes at night of CPAP a compliance of 83% 4 days all for 4 hours of CPAP use. Over the last 90 days the patient had not use the machine for 6 days total. This residual AHI is 1.5 the setting of 10 cm water pressure with 2 cm EPR he has a moderate to mild air leak.  The download data  06-25-14 beginning on  03-28-14.  The patient's bedtime is around 9:30 PM, he usually falls asleep within 30 minutes, some nights he will have one nocturia but not more and not daily. He rises at 6 AM, usually he is awake before his alarm rings. Overall he has well over 7 hours of nocturnal sleep. He does not nap in daytime. He will have a  protein rich low-carb breakfast, he communes for about 25 minutes to work where he is exposed to [employer] and his desk is in close proximity to a Window. The patient is a Teacher, early years/prepharmacist.          Review of Systems: Out of a complete 14 system review, the patient complains of only the following symptoms, and all other reviewed systems are negative.   Epworth score 3 , Fatigue severity score12 , depression score 1 point on GDS.   History   Social History  . Marital Status: Single    Spouse Name: N/A    Number of Children: 0  . Years of Education: college   Occupational History  . pharmacist     [employer]   Social History Main Topics  . Smoking status: Never Smoker   . Smokeless tobacco: Never Used  . Alcohol Use: No  . Drug Use: No  . Sexual Activity: Not on file   Other Topics Concern  . Not on file   Social History Narrative   This african [place] male, right handed has  a [employer]icianharm[employer] Doctorate degree,     works as Teacher, early years/preharmacist for [employer] [place]- is single, [place] mother lives in [place].   Resides alone in a private home, drinks very little caffeine.  Family History  Problem Relation Age of Onset  . Cancer Father   . Hypertension Father   . Hyperlipidemia Father   . Glaucoma Paternal Aunt   . Stroke Mother   . Dementia Mother   . Anemia Mother     severe  . Cancer Mother     colon  . Hyperlipidemia Sister   . Hypertension Sister   . Headache Sister   . Hypertension Brother   . Hyperlipidemia Brother     Past Medical History  Diagnosis Date  . GERD (gastroesophageal reflux disease)   . Hypertension   . Hyperlipidemia   . Allergy   . Anxiety   . OSA on CPAP   . Glaucoma   . Headache(784.0)   . Cataract cortical, zonular 06/26/2014    Past Surgical History  Procedure Laterality Date  . Shoulder arthroscopy Right 2009    rotator cuff  . Shoulder arthroscopy with rotator cuff repair    .  Testicle torsion reduction  1981  . Cataract extraction w/phaco Left 10/31/2013    Procedure: CATARACT EXTRACTION PHACO AND INTRAOCULAR LENS PLACEMENT (IOC);  Surgeon: Shade Flood, MD;  Location: [facility] OR;  Service: Ophthalmology;  Laterality: Left;    Current Outpatient Prescriptions  Medication Sig Dispense Refill  . aspirin 325 MG EC tablet Take 325 mg by mouth daily.      . Azelaic Acid (FINACEA) 15 % cream Apply 1 application topically at bedtime. After skin is thoroughly washed and patted dry, gently but thoroughly massage a thin film of azelaic acid cream into the affected area daily, in the evening.      . cyclobenzaprine (FLEXERIL) 10 MG tablet Take 10 mg by mouth 3 (three) times daily as needed for muscle spasms.      . Econazole Nitrate (ECOZA) 1 % FOAM Apply 1 application topically at bedtime.      . fluticasone (CUTIVATE) 0.05 % cream Apply 1 application topically at bedtime.      . fluticasone (FLONASE) 50 MCG/ACT nasal spray Place 1 spray into both nostrils daily.      Marland Kitchen latanoprost (XALATAN) 0.005 % ophthalmic solution Place 1 drop into both eyes at bedtime.      . Multiple Vitamin (MULTIVITAMIN WITH MINERALS) TABS tablet Take 1 tablet by mouth daily.      . pravastatin (PRAVACHOL) 40 MG tablet Take 40 mg by mouth daily.      . SUMAtriptan (IMITREX) 100 MG tablet Take 100 mg by mouth every 2 (two) hours as needed for migraine or headache. May repeat in 2 hours if headache persists or recurs.      . Testosterone 12.5 MG/ACT (1%) GEL Place 1 application onto the skin daily.      . verapamil (CALAN-SR) 240 MG CR tablet Take 240 mg by mouth at bedtime.       No current facility-administered medications for this visit.    Allergies as of 06/26/2014  . (No Known Allergies)    Vitals: BP 141/89  Pulse 80  Resp 16  Ht 5' 7.25" (1.708 m)  Wt 184 lb (83.462 kg)  BMI 28.61 kg/m2 Last Weight:  Wt Readings from Last 1 Encounters:  06/26/14 184 lb (83.462 kg)       Last Height:     Ht Readings from Last 1 Encounters:  06/26/14 5' 7.25" (1.708 m)    Physical exam:  General: The patient is awake, alert and appears not in acute distress. The patient is well groomed. Head:  Normocephalic, atraumatic. Neck is supple. Mallampati 3   neck circumference: 16.5 inches  Nasal airflow unrestricted ,  TMJ is not evident . Retrognathia is not seen.  Cardiovascular:  Regular rate and rhythm, without  murmurs or carotid bruit, and without distended neck veins. Respiratory: Lungs are clear to auscultation. Skin:  Without evidence of edema, or rash Trunk: BMI is not longer elevated and patient has normal posture.  Neurologic exam : The patient is awake and alert, oriented to place and time.   Memory subjective  described as intact. There is a normal attention span & concentration ability. Speech is fluent without  dysarthria, dysphonia or aphasia.  Mood and affect are appropriate.  Cranial nerves: Pupils are equal and briskly reactive to light.  Extraocular movements  in vertical and horizontal planes intact and without nystagmus. Visual fields by finger perimetry are intact. Hearing to finger rub intact.  Facial sensation intact to fine touch. Facial motor strength is symmetric and tongue and uvula move midline.  Motor exam:    Normal tone ,muscle bulk and symmetric ,strength in all extremities.  Sensory: . Proprioception is normal.  Coordination: Rapid alternating movements in the fingers/hands is normal.   Gait and station: Patient walks without assistive device .  Deep tendon reflexes: in the  upper and lower extremities are symmetric and intact. Babinski maneuver response is   downgoing.   Assessment:  After physical and neurologic examination, review of laboratory studies, imaging, neurophysiology testing and pre-existing records, assessment is ;  OSA , severe in 2010 , well controlled on CPAP . The patient has lost weight meanwhile and is likely not to have a severe  form of apnea any longer. A HST may confirm the degree as currently.    The patient was advised of the nature of the diagnosed sleep disorder , the treatment options and risks for general a health and wellness arising from not treating the condition. Visit duration was 25 minutes.   Plan:  Treatment plan and additional workup : continue  Current CPAP settings unless HST confirms resolution of apnea at baseline. A retitration is based on download data not  necessary.     Porfirio Mylar Sydney Azure MD  06/26/2014

## 2014-06-27 NOTE — Telephone Encounter (Signed)
That's fine. There is no urgency as he is doing well on CPAP, and has lost weight.

## 2014-10-05 ENCOUNTER — Ambulatory Visit (INDEPENDENT_AMBULATORY_CARE_PROVIDER_SITE_OTHER): Payer: BC Managed Care – PPO | Admitting: Family Medicine

## 2014-10-05 VITALS — BP 136/92 | HR 84 | Temp 98.8°F | Resp 16 | Ht 68.0 in | Wt 181.6 lb

## 2014-10-05 DIAGNOSIS — L219 Seborrheic dermatitis, unspecified: Secondary | ICD-10-CM | POA: Insufficient documentation

## 2014-10-05 DIAGNOSIS — B9789 Other viral agents as the cause of diseases classified elsewhere: Principal | ICD-10-CM

## 2014-10-05 DIAGNOSIS — J069 Acute upper respiratory infection, unspecified: Secondary | ICD-10-CM

## 2014-10-05 MED ORDER — GUAIFENESIN ER 1200 MG PO TB12: 1.0000 | ORAL_TABLET | Freq: Two times a day (BID) | ORAL | Status: DC | PRN

## 2014-10-05 MED ORDER — HYDROCOD POLST-CHLORPHEN POLST 10-8 MG/5ML PO LQCR: 5.0000 mL | Freq: Two times a day (BID) | ORAL | Status: DC | PRN

## 2014-10-05 NOTE — Progress Notes (Signed)
Subjective:    Patient ID: William [name], male    DOB: [birth year], 53 y.o.   MRN: [id number] Patient Active Problem List   Diagnosis Date Noted  . Cataract cortical, zonular 06/26/2014  . Loss of weight 06/26/2014  . HTN (hypertension) 12/27/2011  . Hyperlipidemia 12/27/2011  . GERD (gastroesophageal reflux disease) 12/27/2011  . OSA on CPAP 12/27/2011  . Hypogonadism male 12/27/2011  . Migraine 12/27/2011  . AR (allergic rhinitis) 12/27/2011  . Anxiety 12/27/2011   Prior to Admission medications   Medication Sig Start Date End Date Taking? Authorizing Provider  aspirin 325 MG EC tablet Take 325 mg by mouth daily.   Yes Historical Provider, MD  Azelaic Acid (FINACEA) 15 % cream Apply 1 application topically at bedtime. After skin is thoroughly washed and patted dry, gently but thoroughly massage a thin film of azelaic acid cream into the affected area daily, in the evening.   Yes Historical Provider, MD  cyclobenzaprine (FLEXERIL) 10 MG tablet Take 10 mg by mouth 3 (three) times daily as needed for muscle spasms.   Yes Historical Provider, MD  Econazole Nitrate (ECOZA) 1 % FOAM Apply 1 application topically at bedtime.   Yes Historical Provider, MD  fluticasone (CUTIVATE) 0.05 % cream Apply 1 application topically at bedtime.   Yes Historical Provider, MD  fluticasone (FLONASE) 50 MCG/ACT nasal spray Place 1 spray into both nostrils daily.   Yes Historical Provider, MD  latanoprost (XALATAN) 0.005 % ophthalmic solution Place 1 drop into both eyes at bedtime.   Yes Historical Provider, MD  Multiple Vitamin (MULTIVITAMIN WITH MINERALS) TABS tablet Take 1 tablet by mouth daily.   Yes Historical Provider, MD  pravastatin (PRAVACHOL) 40 MG tablet Take 40 mg by mouth daily.   Yes Historical Provider, MD  SUMAtriptan (IMITREX) 100 MG tablet Take 100 mg by mouth every 2 (two) hours as needed for migraine or headache. May repeat in 2 hours if headache persists or recurs.   Yes Historical  Provider, MD  Testosterone 12.5 MG/ACT (1%) GEL Place 1 application onto the skin daily.   Yes Historical Provider, MD  verapamil (CALAN-SR) 240 MG CR tablet Take 240 mg by mouth at bedtime.   Yes Historical Provider, MD   No Known Allergies  HPI  This is a 53 year old male with PMH HTN and glaucoma presenting with 4 days of malaise, chills, fever, and cough. He reports he is feeling better but the cough is getting worse. The cough has been productive of green sputum x 1 day. The highest his fever got was 100.0. He was using tylenol for this and worked well. No longer has a fever. He has been using mucinex with cough suppressant without relief. He has not slept well the past few nights d/t coughing. He has some slight nasal congestion. He denies otalgia, wheezing, SOB. He is not a smoker and does not have a history of asthma. He had the flu shot 2 weeks ago.  Review of Systems  Constitutional: Positive for fever and chills.  HENT: Positive for congestion. Negative for ear pain, sinus pressure and sore throat.   Eyes: Negative.   Respiratory: Positive for cough. Negative for shortness of breath and wheezing.   Gastrointestinal: Negative for nausea, vomiting and diarrhea.  Skin: Negative for rash.  Allergic/Immunologic: Negative for environmental allergies.  Neurological: Negative for headaches.      Objective:   Physical Exam  Constitutional: He is oriented to person, place, and time. He appears  well-developed and well-nourished. No distress.  HENT:  Head: Normocephalic and atraumatic.  Right Ear: Hearing, tympanic membrane, external ear and ear canal normal.  Left Ear: Hearing, tympanic membrane, external ear and ear canal normal.  Nose: Nose normal.  Mouth/Throat: Uvula is midline and mucous membranes are normal. Posterior oropharyngeal erythema present. No oropharyngeal exudate.  Eyes: Conjunctivae and lids are normal. Right eye exhibits no discharge. Left eye exhibits no discharge.  No scleral icterus.  Cardiovascular: Normal rate, regular rhythm, normal heart sounds and normal pulses.   No murmur heard. Pulmonary/Chest: Effort normal and breath sounds normal. No respiratory distress. He has no wheezes. He has no rhonchi. He has no rales.  Musculoskeletal: Normal range of motion.  Lymphadenopathy:       Head (right side): No submental, no submandibular, no tonsillar, no preauricular, no posterior auricular and no occipital adenopathy present.       Head (left side): No submental, no submandibular, no tonsillar, no preauricular, no posterior auricular and no occipital adenopathy present.    He has cervical adenopathy.       Right cervical: Superficial cervical adenopathy present. No deep cervical and no posterior cervical adenopathy present.      Left cervical: No superficial cervical, no deep cervical and no posterior cervical adenopathy present.  Neurological: He is alert and oriented to person, place, and time.  Skin: Skin is warm, dry and intact. No lesion and no rash noted.  Psychiatric: He has a normal mood and affect. His speech is normal and behavior is normal. Thought content normal.      Assessment & Plan:  1. Viral URI with cough Patient likely has a viral URI that is improving. Will give tussionex for cough suppression - he has taken this before without problems. He will stop mucinex with cough suppressant and start regular mucinex. Will return in 7-10 days if symptoms worsen or fail to improve.  - chlorpheniramine-HYDROcodone (TUSSIONEX PENNKINETIC ER) 10-8 MG/5ML LQCR; Take 5 mLs by mouth every 12 (twelve) hours as needed for cough (cough).  Dispense: 100 mL; Refill: 0 - Guaifenesin (MUCINEX MAXIMUM STRENGTH) 1200 MG TB12; Take 1 tablet (1,200 mg total) by mouth every 12 (twelve) hours as needed.  Dispense: 14 tablet; Refill: 1   Nicole V. Dyke BrackettBush, PA-C, MHS [facility]  10/05/2014

## 2014-10-05 NOTE — Patient Instructions (Signed)
Drink plenty of water and get plenty of rest Return if symptoms not improved in 7-10 days.

## 2015-06-26 ENCOUNTER — Ambulatory Visit: Payer: BC Managed Care – PPO | Admitting: Neurology

## 2015-07-09 ENCOUNTER — Ambulatory Visit: Payer: Self-pay | Admitting: Neurology

## 2015-07-16 ENCOUNTER — Encounter: Payer: Self-pay | Admitting: Neurology

## 2015-07-16 ENCOUNTER — Ambulatory Visit (INDEPENDENT_AMBULATORY_CARE_PROVIDER_SITE_OTHER): Payer: BLUE CROSS/BLUE SHIELD | Admitting: Neurology

## 2015-07-16 VITALS — BP 136/82 | HR 68 | Resp 20 | Ht 67.0 in | Wt 181.0 lb

## 2015-07-16 DIAGNOSIS — Z9989 Dependence on other enabling machines and devices: Principal | ICD-10-CM

## 2015-07-16 DIAGNOSIS — G4733 Obstructive sleep apnea (adult) (pediatric): Secondary | ICD-10-CM | POA: Diagnosis not present

## 2015-07-16 NOTE — Patient Instructions (Signed)
Sleep Apnea  Sleep apnea is disorder that affects a person's sleep. A person with sleep apnea has abnormal pauses in their breathing when they sleep. It is hard for them to get a good sleep. This makes a person tired during the day. It also can lead to other physical problems. There are three types of sleep apnea. One type is when breathing stops for a short time because your airway is blocked (obstructive sleep apnea). Another type is when the brain sometimes fails to give the normal signal to breathe to the muscles that control your breathing (central sleep apnea). The third type is a combination of the other two types.  HOME CARE  · Do not sleep on your back. Try to sleep on your side.  · Take all medicine as told by your doctor.  · Avoid alcohol, calming medicines (sedatives), and depressant drugs.  · Try to lose weight if you are overweight. Talk to your doctor about a healthy weight goal.  Your doctor may have you use a device that helps to open your airway. It can help you get the air that you need. It is called a positive airway pressure (PAP) device. There are three types of PAP devices:  · Continuous positive airway pressure (CPAP) device.  · Nasal expiratory positive airway pressure (EPAP) device.  · Bilevel positive airway pressure (BPAP) device.  MAKE SURE YOU:  · Understand these instructions.  · Will watch your condition.  · Will get help right away if you are not doing well or get worse.  Document Released: 08/10/2008 Document Revised: 10/18/2012 Document Reviewed: 03/04/2012  Exit[employer]® Patient Information ©2015 [employer]. This information is not intended to replace advice given to you by your health care provider. Make sure you discuss any questions you have with your health care provider.

## 2015-07-16 NOTE — Progress Notes (Signed)
SLEEP MEDICINE CLINIC   Provider:  Melvyn Novas, M D  Referring Provider: Juluis Rainier, MD Primary Care Physician:  Gaye Alken, MD  Chief Complaint  Patient presents with  . Follow-up    cpap, cpap chip is corrupt, unable to obtain download, [facility] notified and they are mailing pt a new chip,rm 11, alone    HPI:  [name] is a 54 y.o. male , seen here as a revisit  from Dr. Zachery Dauer for  CPAP compliance and to establish the need of further CPAP treatment after weight loss.  Chief complaint according to patient :  William [name] is evaluated for sleep apnea on 08-14-09, at the time patient of Dr. Clydie Braun [facility]. At that time he had a BMI of 38.9 and a documented history of hypertension,  hypogonadism with testosterone deficiency, migraines obesity and presented with witnessed snoring and apnea as well as hypersomnia in daytime.  He was diagnosed with an AHI of 30.2,  in supine sleep his AHI became 73.1 per hour of sleep and during REM sleep it was exacerbated to 47.6 AHI. Oxygen nadir was 81% but only 7.4 minutes of total desaturation time are noted.  The patient was asked to return for a CPAP titration and has used CPAP since August 2010 . He has been followed by [facility],  He changed later to [facility] as his DME. He has lost weight since his initial study, but recently regained a few pounds on prednisone therapy for neck spasm , cervical DDD. The patient's bedtime is around 9:30 PM, he usually falls asleep within 30 minutes, some nights he will have one nocturia but not more and not daily. He rises at 6 AM, usually he is awake before his alarm rings. Overall he has well over 7 hours of nocturnal sleep. He does not nap in daytime. He will have a protein rich low-carb breakfast, he communes for about 25 minutes to work where he is exposed to [employer] and his desk is in close proximity to a Window.  The patient is a Teacher, early years/pre.  Interval  history from 07-16-15.   William [name] has further been able to lose weight and wonders if he still needs to use his CPAP. Unfortunately the download capacity for his CPAP was "corrupted",  the memory chip did not work. [facility] are awaiting [facility] to send a report to [place]. William [name] has always been a compliant CPAP user and I have no doubt that this has continued. He endorsed the first fatigue questionnaire at 21 points and the Epworth sleepiness score at 4 points.  His bedtime is usually between 9:30 and 10:30 PM and he usually falls asleep within about 30 minutes. He may have one bathroom break but usually not more. He rises mornings at 6 AM usually spontaneously awake before the alarm rings. He rarely drinks caffeinated beverages. He works in [employer] and has access to [employer] during his work hours. We discussed today that his further weight gain may have made his CPAP more longer necessary. Last year I ordered a home sleep test which was not covered by his insurance carrier. His insurance wanted him to have an attended sleep study instead. I would like to order the attended sleep study this year the patient will need to know how much co-pay arises for him. If an out of pocket home sleep test is cheaper for him we will promote this option.    Sleep medical history and  family sleep history:  The patient has no known family member with apnea, mother is snoring and stopping [place]) .     Review of Systems: Out of a complete 14 system review, the patient complains of only the following symptoms, and all other reviewed systems are negative.  pworth score 4 , Fatigue severity score 21 , depression score 0, rarely snores on CPAP , still sores off CPAP.    Social History   Social History  . Marital Status: Single    Spouse Name: N/A  . Number of Children: 0  . Years of Education: college   Occupational History  . pharmacist     [employer]   Social  History Main Topics  . Smoking status: Never Smoker   . Smokeless tobacco: Never Used  . Alcohol Use: No  . Drug Use: No  . Sexual Activity: Not on file   Other Topics Concern  . Not on file   Social History Narrative   This african [place] male, right handed has  a Musician,     works as Teacher, early years/pre for [employer] here in [place] for 12 years- is single, his mother lives in [place].   Resides alone in a private home, drinks very little caffeine.             Family History  Problem Relation Age of Onset  . Cancer Father   . Hypertension Father   . Hyperlipidemia Father   . Glaucoma Paternal Aunt   . Stroke Mother   . Dementia Mother   . Anemia Mother     severe  . Cancer Mother     colon  . Hyperlipidemia Sister   . Hypertension Sister   . Headache Sister   . Hypertension Brother   . Hyperlipidemia Brother     Past Medical History  Diagnosis Date  . GERD (gastroesophageal reflux disease)   . Hypertension   . Hyperlipidemia   . Allergy   . Anxiety   . OSA on CPAP   . Glaucoma   . Headache(784.0)   . Cataract cortical, zonular 06/26/2014  . Loss of weight 06/26/2014    27 pounds since last sleep study- recheck need for CPAP by HST.     Past Surgical History  Procedure Laterality Date  . Shoulder arthroscopy Right 2009    rotator cuff  . Shoulder arthroscopy with rotator cuff repair    . Testicle torsion reduction  1981  . Cataract extraction w/phaco Left 10/31/2013    Procedure: CATARACT EXTRACTION PHACO AND INTRAOCULAR LENS PLACEMENT (IOC);  Surgeon: Shade Flood, MD;  Location: [facility] OR;  Service: Ophthalmology;  Laterality: Left;    Current Outpatient Prescriptions  Medication Sig Dispense Refill  . aspirin 325 MG EC tablet Take 325 mg by mouth daily.    . Azelaic Acid (FINACEA) 15 % cream Apply 1 application topically at bedtime. After skin is thoroughly washed and patted dry, gently but thoroughly massage a thin film of  azelaic acid cream into the affected area daily, in the evening.    . chlorpheniramine-HYDROcodone (TUSSIONEX PENNKINETIC ER) 10-8 MG/5ML LQCR Take 5 mLs by mouth every 12 (twelve) hours as needed for cough (cough). 100 mL 0  . cyclobenzaprine (FLEXERIL) 10 MG tablet Take 10 mg by mouth 3 (three) times daily as needed for muscle spasms.    . dorzolamide-timolol (COSOPT) 22.3-6.8 MG/ML ophthalmic solution     . Econazole Nitrate (ECOZA) 1 % FOAM Apply 1  application topically at bedtime.    . fluticasone (CUTIVATE) 0.05 % cream Apply 1 application topically at bedtime.    . fluticasone (FLONASE) 50 MCG/ACT nasal spray Place 1 spray into both nostrils daily.    . Guaifenesin (MUCINEX MAXIMUM STRENGTH) 1200 MG TB12 Take 1 tablet (1,200 mg total) by mouth every 12 (twelve) hours as needed. 14 tablet 1  . latanoprost (XALATAN) 0.005 % ophthalmic solution Place 1 drop into both eyes at bedtime.    . Multiple Vitamin (MULTIVITAMIN WITH MINERALS) TABS tablet Take 1 tablet by mouth daily.    . pravastatin (PRAVACHOL) 40 MG tablet Take 40 mg by mouth daily.    . SUMAtriptan (IMITREX) 100 MG tablet Take 100 mg by mouth every 2 (two) hours as needed for migraine or headache. May repeat in 2 hours if headache persists or recurs.    . Testosterone 12.5 MG/ACT (1%) GEL Place 1 application onto the skin daily.    . verapamil (CALAN-SR) 240 MG CR tablet Take 240 mg by mouth at bedtime.     No current facility-administered medications for this visit.    Allergies as of 07/16/2015  . (No Known Allergies)    Vitals: BP 136/82 mmHg  Pulse 68  Resp 20  Ht  (1.702 m)  Wt 181 lb (82.101 kg)  BMI 28.34 kg/m2 Last Weight:  Wt Readings from Last 1 Encounters:  07/16/15 181 lb (82.101 kg)   ZOX:WRUE mass index is 28.34 kg/(m^2).     Last Height:   Ht Readings from Last 1 Encounters:  07/16/15  (1.702 m)    Physical exam:  General: The patient is awake, alert and appears not in acute distress. The  patient is well groomed. Head: Normocephalic, atraumatic. Neck is supple. Mallampati 3  neck circumference: 15. Nasal airflow unrestricted , TMJ click is not  evident .Cardiovascular:  Regular rate and rhythm, without  murmurs or carotid bruit, and without distended neck veins. Respiratory: Lungs are clear to auscultation. Skin:  Without evidence of edema, or rash Trunk: BMI is normal  Neurologic exam : The patient is awake and alert, oriented to place and time.   Memory subjective  described as intact.    Attention span & concentration ability appears normal.  Speech is fluent, without dysarthria, dysphonia or aphasia.  Mood and affect are appropriate.  Cranial nerves: Pupils are equal and briskly reactive to light.  Extraocular movements  in vertical and horizontal planes intact and without nystagmus. Visual fields by finger perimetry are intact. Hearing to finger rub intact. Facial sensation intact to fine touch. Facial motor strength is symmetric and tongue and uvula move midline. Shoulder shrug was symmetrical.   Motor exam: Normal tone, muscle bulk and symmetric strength in all extremities.  Sensory:  Fine touch, pinprick and vibration were normal.  Coordination: Rapid alternating movements in the fingers/hands was normal.   Gait and station: Patient walks without assistive device and is able unassisted to climb up to the exam table.   Deep tendon reflexes: in the  upper and lower extremities are symmetric and intact. Babinski maneuver response is  downgoing.  The patient was advised of the nature of the diagnosed sleep disorder , the treatment options and risks for general a health and wellness arising from not treating the condition.  I spent more than 15  minutes of face to face time with the patient. Greater than 50% of time was spent in counseling and coordination of care. We have discussed  the diagnosis and differential and I answered the patient's questions.   We  discussed today that his further weight gain may have made his CPAP more longer necessary. Last year I ordered a home sleep test which was not covered by his insurance carrier. His insurance wanted him to have an attended sleep study instead. I would like to order the attended sleep study this year the patient will need to know how much co-pay arises for him. If an out of pocket home sleep test is cheaper for him we will promote this option.    Assessment:  After physical and neurologic examination, review of laboratory studies,  Personal review of imaging studies, reports of other /same  Imaging studies ,  Results of polysomnography/ neurophysiology testing and pre-existing records as far as provided in visit., my assessment is   1) In office PSG versus HST order, depending on insurance coverage.   2)new chip for CPAP form [facility] ordered.   3) Rv after HST or PSG         William Mylar Letisha Yera MD  07/16/2015   CC: Juluis Rainier, Md [address] [place], [place] [address]

## 2015-07-28 ENCOUNTER — Telehealth: Payer: Self-pay | Admitting: Neurology

## 2015-07-28 DIAGNOSIS — Z9989 Dependence on other enabling machines and devices: Principal | ICD-10-CM

## 2015-07-28 DIAGNOSIS — G4733 Obstructive sleep apnea (adult) (pediatric): Secondary | ICD-10-CM

## 2015-07-28 NOTE — Telephone Encounter (Signed)
Done

## 2015-07-28 NOTE — Telephone Encounter (Signed)
Spoke with the patient and due to cost he would like a HST.  Will you please change the order to HST from [facility]

## 2015-07-28 NOTE — Addendum Note (Signed)
Addended byHermenia Fiscal on: 07/28/2015 04:27 PM   Modules accepted: Orders

## 2015-11-17 ENCOUNTER — Ambulatory Visit (INDEPENDENT_AMBULATORY_CARE_PROVIDER_SITE_OTHER): Payer: BLUE CROSS/BLUE SHIELD | Admitting: Family Medicine

## 2015-11-17 VITALS — BP 124/80 | HR 58 | Temp 97.8°F | Resp 18 | Ht 68.25 in | Wt 180.8 lb

## 2015-11-17 DIAGNOSIS — J0110 Acute frontal sinusitis, unspecified: Secondary | ICD-10-CM

## 2015-11-17 DIAGNOSIS — J029 Acute pharyngitis, unspecified: Secondary | ICD-10-CM

## 2015-11-17 MED ORDER — AMOXICILLIN 500 MG PO CAPS: 1000.0000 mg | ORAL_CAPSULE | Freq: Two times a day (BID) | ORAL | Status: DC

## 2015-11-17 NOTE — Patient Instructions (Signed)
We are going to treat you for a sinus infection and possible throat infection with amoxicillin (antibiotic)- use this as directed.  Drink plenty of fluids and rest.  Ok to use plain mucinex, tylenol as needed Let me know if you do not feel better in the next few days- Sooner if worse.

## 2015-11-17 NOTE — Progress Notes (Signed)
[facility] [address], [place] [id number] [phone]  Date:  11/17/2015   Name:  William LlanosWalter D Stahly   DOB:  13-Jun-1961   MRN:  [id number]  PCP:  Gaye AlkenBARNES,ELIZABETH STEWART, MD    Chief Complaint: Nasal Congestion   History of Present Illness:  William LlanosWalter D Tabar is a 55 y.o. very pleasant male patient who presents with the following:  He is here today with illness- he has noted thick nasal discharge that gets stuck in his throat and he also has some PND.  It bothers him when he talks or lies down at night.   He has noted some pressure and pain in his sinuses around his nose.    He came here a week ago but was not seen because the wait was too long No cough, no fever, no body aches.   He has not noted a ST but the top of his mouth is irritated.    He does have a history of glaucoma.    Patient Active Problem List   Diagnosis Date Noted  . Seborrheic dermatitis 10/05/2014  . Cataract cortical, zonular 06/26/2014  . Loss of weight 06/26/2014  . HTN (hypertension) 12/27/2011  . Hyperlipidemia 12/27/2011  . GERD (gastroesophageal reflux disease) 12/27/2011  . OSA on CPAP 12/27/2011  . Hypogonadism male 12/27/2011  . Migraine 12/27/2011  . AR (allergic rhinitis) 12/27/2011  . Anxiety 12/27/2011    Past Medical History  Diagnosis Date  . GERD (gastroesophageal reflux disease)   . Hypertension   . Hyperlipidemia   . Allergy   . Anxiety   . OSA on CPAP   . Glaucoma   . Headache(784.0)   . Cataract cortical, zonular 06/26/2014  . Loss of weight 06/26/2014    27 pounds since last sleep study- recheck need for CPAP by HST.     Past Surgical History  Procedure Laterality Date  . Shoulder arthroscopy Right 2009    rotator cuff  . Shoulder arthroscopy with rotator cuff repair    . Testicle torsion reduction  1981  . Cataract extraction w/phaco Left 10/31/2013    Procedure: CATARACT EXTRACTION PHACO AND INTRAOCULAR LENS PLACEMENT (IOC);  Surgeon:  Shade FloodGreer Geiger, MD;  [facility];  Service: Ophthalmology;  Laterality: Left;    Social History  Substance Use Topics  . Smoking status: Never Smoker   . Smokeless tobacco: Never Used  . Alcohol Use: No    Family History  Problem Relation Age of Onset  . Cancer Father   . Hypertension Father   . Hyperlipidemia Father   . Glaucoma Paternal Aunt   . Stroke Mother   . Dementia Mother   . Anemia Mother     severe  . Cancer Mother     colon  . Hyperlipidemia Sister   . Hypertension Sister   . Headache Sister   . Hypertension Brother   . Hyperlipidemia Brother     No Known Allergies  Medication list has been reviewed and updated.  Current Outpatient Prescriptions on File Prior to Visit  Medication Sig Dispense Refill  . aspirin 325 MG EC tablet Take 325 mg by mouth daily.    Marland Kitchen. latanoprost (XALATAN) 0.005 % ophthalmic solution Place 1 drop into both eyes at bedtime.    . Multiple Vitamin (MULTIVITAMIN WITH MINERALS) TABS tablet Take 1 tablet by mouth daily.    . pravastatin (PRAVACHOL) 40 MG tablet Take 40 mg by mouth daily.    . verapamil (  CALAN-SR) 240 MG CR tablet Take 240 mg by mouth at bedtime.    . Azelaic Acid (FINACEA) 15 % cream Apply 1 application topically at bedtime. Reported on 11/17/2015    . chlorpheniramine-HYDROcodone (TUSSIONEX PENNKINETIC ER) 10-8 MG/5ML LQCR Take 5 mLs by mouth every 12 (twelve) hours as needed for cough (cough). (Patient not taking: Reported on 11/17/2015) 100 mL 0  . cyclobenzaprine (FLEXERIL) 10 MG tablet Take 10 mg by mouth 3 (three) times daily as needed for muscle spasms. Reported on 11/17/2015    . dorzolamide-timolol (COSOPT) 22.3-6.8 MG/ML ophthalmic solution Reported on 11/17/2015    . Econazole Nitrate (ECOZA) 1 % FOAM Apply 1 application topically at bedtime. Reported on 11/17/2015    . fluticasone (CUTIVATE) 0.05 % cream Apply 1 application topically at bedtime. Reported on 11/17/2015    . fluticasone (FLONASE) 50 MCG/ACT nasal spray  Place 1 spray into both nostrils daily. Reported on 11/17/2015    . Guaifenesin (MUCINEX MAXIMUM STRENGTH) 1200 MG TB12 Take 1 tablet (1,200 mg total) by mouth every 12 (twelve) hours as needed. (Patient not taking: Reported on 11/17/2015) 14 tablet 1  . SUMAtriptan (IMITREX) 100 MG tablet Take 100 mg by mouth every 2 (two) hours as needed for migraine or headache. Reported on 11/17/2015    . Testosterone 12.5 MG/ACT (1%) GEL Place 1 application onto the skin daily. Reported on 11/17/2015     No current facility-administered medications on file prior to visit.    Review of Systems:  As per HPI- otherwise negative.   Physical Examination: Filed Vitals:   11/17/15 0904  BP: 124/80  Pulse: 58  Temp: 97.8 F (36.6 C)  Resp: 18   Filed Vitals:   11/17/15 0904  Height: 5' 8.25" (1.734 m)  Weight: 180 lb 12.8 oz (82.01 kg)   Body mass index is 27.28 kg/(m^2). Ideal Body Weight: Weight in (lb) to have BMI = 25: 165.3  GEN: WDWN, NAD, Non-toxic, A & O x 3, mild overweight, looks well HEENT: Atraumatic, Normocephalic. Neck supple. No masses, No LAD.  Bilateral TM wnl, oropharynx is inflamed wihtout exudate, some petechiae.  PEERL,EOMI.    Nasal cavity is inflamed Ears and Nose: No external deformity. CV: RRR, No M/G/R. No JVD. No thrill. No extra heart sounds. PULM: CTA B, no wheezes, crackles, rhonchi. No retractions. No resp. distress. No accessory muscle use. EXTR: No c/c/e NEURO Normal gait.  PSYCH: Normally interactive. Conversant. Not depressed or anxious appearing.  Calm demeanor.     Assessment and Plan: Acute frontal sinusitis, recurrence not specified - Plan: amoxicillin (AMOXIL) 500 MG capsule  Pharyngitis - Plan: amoxicillin (AMOXIL) 500 MG capsule  Treat as above- See patient instructions for more details.     Signed Abbe Amsterdam, MD

## 2016-02-27 ENCOUNTER — Ambulatory Visit (INDEPENDENT_AMBULATORY_CARE_PROVIDER_SITE_OTHER): Payer: BLUE CROSS/BLUE SHIELD | Admitting: Physician Assistant

## 2016-02-27 VITALS — BP 122/72 | HR 60 | Temp 98.0°F | Resp 17 | Ht 67.0 in | Wt 179.0 lb

## 2016-02-27 DIAGNOSIS — J069 Acute upper respiratory infection, unspecified: Secondary | ICD-10-CM

## 2016-02-27 NOTE — Progress Notes (Signed)
02/27/2016 8:18 AM   DOB: 02-23-1961 / MRN: [id number]  SUBJECTIVE:  William [name] is a 55 y.o. male here today for the evaluation of blood tinged nasal secretions.  States this started 3 days ago and denies frank epistasis. Associates a mild sore throat.  He feels overall well.  He is eating well and pushing fluids.  He is taking flonase.  Has tried saline washes with some relief.  No sneezing, ear itching, cough, fever.   He has No Known Allergies.   He  has a past medical history of GERD (gastroesophageal reflux disease); Hypertension; Hyperlipidemia; Allergy; Anxiety; OSA on CPAP; Glaucoma; Headache(784.0); Cataract cortical, zonular (06/26/2014); and Loss of weight (06/26/2014).    He  reports that he has never smoked. He has never used smokeless tobacco. He reports that he does not drink alcohol or use illicit drugs. He  has no sexual activity history on file. The patient  has past surgical history that includes Shoulder arthroscopy (Right, 2009); Shoulder arthroscopy with rotator cuff repair; Testicle torsion reduction (1981); and Cataract extraction w/PHACO (Left, 10/31/2013).  His family history includes Anemia in his mother; Cancer in his father and mother; Dementia in his mother; Glaucoma in his paternal aunt; Headache in his sister; Hyperlipidemia in his brother, father, and sister; Hypertension in his brother, father, and sister; Stroke in his mother.  Review of Systems  Constitutional: Negative for malaise/fatigue and diaphoresis.  HENT: Positive for nosebleeds and sore throat. Negative for ear discharge and tinnitus.   Musculoskeletal: Negative for myalgias.  Skin: Negative for rash.  Neurological: Negative for dizziness and headaches.    Problem list and medications reviewed and updated by myself where necessary, and exist elsewhere in the encounter.   OBJECTIVE:  BP 122/72 mmHg  Pulse 60  Temp(Src) 98 F (36.7 C) (Oral)  Resp 17  Ht  (1.702 m)  Wt 179 lb  (81.194 kg)  BMI 28.03 kg/m2  SpO2 98%  Physical Exam  Constitutional: He is oriented to person, place, and time. He appears well-nourished. No distress.  HENT:  Right Ear: Hearing and tympanic membrane normal.  Left Ear: Hearing and tympanic membrane normal.  Nose: Mild erosion note on the medial septum 0.5 cm from the distal right nare. Mucosal edema present. No sinus tenderness. Right sinus exhibits no maxillary sinus tenderness and no frontal sinus tenderness. Left sinus exhibits no maxillary sinus tenderness and no frontal sinus tenderness.  Mouth/Throat: Uvula is midline, oropharynx is clear and moist and mucous membranes are normal.  Eyes: EOM are normal. Pupils are equal, round, and reactive to light.  Cardiovascular: Normal rate.   Pulmonary/Chest: Effort normal.  Abdominal: He exhibits no distension.  Neurological: He is alert and oriented to person, place, and time. No cranial nerve deficit. Skin: Skin is dry. He is not diaphoretic.  Vitals reviewed.  No results found for this or any previous visit (from the past 48 hour(s)).  ASSESSMENT AND PLAN:  Karder was seen today for nasal congestion, epistaxis, cough and uri.  Diagnoses and all orders for this visit:  URI (upper respiratory infection): Advised he hold the flonase for a few days.  Continue nasal washing and apply vaseline intranasally bid.  RTC precautions discussed.  F/U prn.     The patient was advised to call or return to clinic if he does not see an improvement in symptoms or to seek the care of the closest emergency department if he worsens with the above plan.   Casimiro Needle  Chestine Sporelark, MHS, PA-C [facility] 02/27/2016 8:18 AM

## 2016-02-27 NOTE — Patient Instructions (Signed)
     IF you received an x-ray today, you will receive an invoice from [facility]. Please contact [facility] at [phone] with questions or concerns regarding your invoice.   IF you received labwork today, you will receive an invoice from [facility]. Please contact [facility] at [phone] with questions or concerns regarding your invoice.   Our billing staff will not be able to assist you with questions regarding bills from these companies.  You will be contacted with the lab results as soon as they are available. The fastest way to get your results is to activate your My Chart account. Instructions are located on the last page of this paperwork. If you have not heard from us regarding the results in 2 weeks, please contact this office.      

## 2017-02-03 ENCOUNTER — Ambulatory Visit (INDEPENDENT_AMBULATORY_CARE_PROVIDER_SITE_OTHER): Payer: Managed Care, Other (non HMO) | Admitting: Physician Assistant

## 2017-02-03 VITALS — BP 128/78 | HR 63 | Temp 98.7°F | Ht 67.25 in | Wt 176.0 lb

## 2017-02-03 DIAGNOSIS — R070 Pain in throat: Secondary | ICD-10-CM | POA: Diagnosis not present

## 2017-02-03 DIAGNOSIS — B9789 Other viral agents as the cause of diseases classified elsewhere: Secondary | ICD-10-CM | POA: Diagnosis not present

## 2017-02-03 DIAGNOSIS — J069 Acute upper respiratory infection, unspecified: Secondary | ICD-10-CM

## 2017-02-03 LAB — POCT RAPID STREP A (OFFICE): Rapid Strep A Screen: NEGATIVE

## 2017-02-03 MED ORDER — GUAIFENESIN ER 1200 MG PO TB12: 1.0000 | ORAL_TABLET | Freq: Two times a day (BID) | ORAL | 1 refills | Status: DC | PRN

## 2017-02-03 NOTE — Patient Instructions (Addendum)
Please continue to hydrate well with water, 64 oz of water.   Use tylenol for your pain and fever.   Influenza, Adult Influenza ("the flu") is an infection in the lungs, nose, and throat (respiratory tract). It is caused by a virus. The flu causes many common cold symptoms, as well as a high fever and body aches. It can make you feel very sick. The flu spreads easily from person to person (is contagious). Getting a flu shot (influenza vaccination) every year is the best way to prevent the flu. Follow these instructions at home:  Take over-the-counter and prescription medicines only as told by your doctor.  Use a cool mist humidifier to add moisture (humidity) to the air in your home. This can make it easier to breathe.  Rest as needed.  Drink enough fluid to keep your pee (urine) clear or pale yellow.  Cover your mouth and nose when you cough or sneeze.  Wash your hands with soap and water often, especially after you cough or sneeze. If you cannot use soap and water, use hand sanitizer.  Stay home from work or school as told by your doctor. Unless you are visiting your doctor, try to avoid leaving home until your fever has been gone for 24 hours without the use of medicine.  Keep all follow-up visits as told by your doctor. This is important. How is this prevented?  Getting a yearly (annual) flu shot is the best way to avoid getting the flu. You may get the flu shot in late summer, fall, or winter. Ask your doctor when you should get your flu shot.  Wash your hands often or use hand sanitizer often.  Avoid contact with people who are sick during cold and flu season.  Eat healthy foods.  Drink plenty of fluids.  Get enough sleep.  Exercise regularly. Contact a doctor if:  You get new symptoms.  You have:  Chest pain.  Watery poop (diarrhea).  A fever.  Your cough gets worse.  You start to have more mucus.  You feel sick to your stomach (nauseous).  You throw up  (vomit). Get help right away if:  You start to be short of breath or have trouble breathing.  Your skin or nails turn a bluish color.  You have very bad pain or stiffness in your neck.  You get a sudden headache.  You get sudden pain in your face or ear.  You cannot stop throwing up. This information is not intended to replace advice given to you by your health care provider. Make sure you discuss any questions you have with your health care provider. Document Released: 08/10/2008 Document Revised: 04/08/2016 Document Reviewed: 08/26/2015 Elsevier Interactive Patient Education  2017 [employer].     IF you received an x-ray today, you will receive an invoice from [facility]. Please contact [facility] at [phone] with questions or concerns regarding your invoice.   IF you received labwork today, you will receive an invoice from [facility]. Please contact [facility] at [phone] with questions or concerns regarding your invoice.   Our billing staff will not be able to assist you with questions regarding bills from these companies.  You will be contacted with the lab results as soon as they are available. The fastest way to get your results is to activate your My Chart account. Instructions are located on the last page of this paperwork. If you have not heard from [place] regarding the results in 2 weeks, please contact  this office.

## 2017-02-03 NOTE — Progress Notes (Signed)
[facility] [address], [place] [id number] [phone]  Date:  02/03/2017   Name:  William [name]   DOB:  Sep 04, 1961   MRN:  [id number]  PCP:  Gaye AlkenBARNES,ELIZABETH STEWART, MD    History of Present Illness:  William [name] is a 56 y.o. male patient who presents to PCP with  Chief Complaint  Patient presents with  . Nasal Congestion    x2 days, sick co workers, pt did get the flu shot  . Sore Throat  . Generalized Body Aches  . Chills     3 days ago, symptoms started with chills and body aches.  He has some malaise and sore throat.  He has congestion.  He had a hard time sleeping.  Used saline to clear his nasal passages.  No coughing.  Subjective fever and chills, but temp 97.9.  Saline irrigation, and took one alka-seltzer cold. Sick coworkers--bronchitis.  He had a flu shot this year.   Patient Active Problem List   Diagnosis Date Noted  . Seborrheic dermatitis 10/05/2014  . Cataract cortical, zonular 06/26/2014  . Loss of weight 06/26/2014  . HTN (hypertension) 12/27/2011  . Hyperlipidemia 12/27/2011  . GERD (gastroesophageal reflux disease) 12/27/2011  . OSA on CPAP 12/27/2011  . Hypogonadism male 12/27/2011  . Migraine 12/27/2011  . AR (allergic rhinitis) 12/27/2011  . Anxiety 12/27/2011    Past Medical History:  Diagnosis Date  . Allergy   . Anxiety   . Cataract cortical, zonular 06/26/2014  . GERD (gastroesophageal reflux disease)   . Glaucoma   . Headache(784.0)   . Hyperlipidemia   . Hypertension   . Loss of weight 06/26/2014   27 pounds since last sleep study- recheck need for CPAP by HST.   . OSA on CPAP     Past Surgical History:  Procedure Laterality Date  . CATARACT EXTRACTION W/PHACO Left 10/31/2013   Procedure: CATARACT EXTRACTION PHACO AND INTRAOCULAR LENS PLACEMENT (IOC);  Surgeon: Shade FloodGreer Geiger, MD;  [facility];  Service: Ophthalmology;  Laterality: Left;  . SHOULDER ARTHROSCOPY Right 2009   rotator cuff  . SHOULDER  ARTHROSCOPY WITH ROTATOR CUFF REPAIR    . TESTICLE TORSION REDUCTION  1981    Social History  Substance Use Topics  . Smoking status: Never Smoker  . Smokeless tobacco: Never Used  . Alcohol use No    Family History  Problem Relation Age of Onset  . Cancer Father   . Hypertension Father   . Hyperlipidemia Father   . Glaucoma Paternal Aunt   . Stroke Mother   . Dementia Mother   . Anemia Mother     severe  . Cancer Mother     colon  . Hyperlipidemia Sister   . Hypertension Sister   . Headache Sister   . Hypertension Brother   . Hyperlipidemia Brother     No Known Allergies  Medication list has been reviewed and updated.  Current Outpatient Prescriptions on File Prior to Visit  Medication Sig Dispense Refill  . aspirin 325 MG EC tablet Take 325 mg by mouth daily.    . Azelaic Acid (FINACEA) 15 % cream Apply 1 application topically at bedtime. Reported on 11/17/2015    . cyclobenzaprine (FLEXERIL) 10 MG tablet Take 10 mg by mouth 3 (three) times daily as needed for muscle spasms. Reported on 11/17/2015    . dorzolamide-timolol (COSOPT) 22.3-6.8 MG/ML ophthalmic solution Reported on 11/17/2015    . Econazole Nitrate (ECOZA) 1 %  FOAM Apply 1 application topically at bedtime. Reported on 11/17/2015    . fluticasone (CUTIVATE) 0.05 % cream Apply 1 application topically at bedtime. Reported on 11/17/2015    . latanoprost (XALATAN) 0.005 % ophthalmic solution Place 1 drop into both eyes at bedtime.    . Multiple Vitamin (MULTIVITAMIN WITH MINERALS) TABS tablet Take 1 tablet by mouth daily.    . pravastatin (PRAVACHOL) 40 MG tablet Take 40 mg by mouth daily.    . SUMAtriptan (IMITREX) 100 MG tablet Take 100 mg by mouth every 2 (two) hours as needed for migraine or headache. Reported on 11/17/2015    . fluticasone (FLONASE) 50 MCG/ACT nasal spray Place 1 spray into both nostrils daily. Reported on 11/17/2015    . Guaifenesin (MUCINEX MAXIMUM STRENGTH) 1200 MG TB12 Take 1 tablet (1,200 mg total)  by mouth every 12 (twelve) hours as needed. (Patient not taking: Reported on 02/03/2017) 14 tablet 1   No current facility-administered medications on file prior to visit.     ROS ROS otherwise unremarkable unless listed above.  Physical Examination: BP 128/78 (BP Location: Right Arm, Patient Position: Sitting, Cuff Size: Large)   Pulse 63   Temp 98.7 F (37.1 C) (Oral)   Ht 5' 7.25" (1.708 m)   Wt 176 lb (79.8 kg)   SpO2 97%   BMI 27.36 kg/m  Ideal Body Weight: Weight in (lb) to have BMI = 25: 160.5  Physical Exam  Constitutional: He is oriented to person, place, and time. He appears well-developed and well-nourished. No distress.  HENT:  Head: Normocephalic and atraumatic.  Mouth/Throat: Oropharyngeal exudate (mild matter at the right tonsillar) and posterior oropharyngeal erythema present.  Eyes: Conjunctivae and EOM are normal. Pupils are equal, round, and reactive to light.  Cardiovascular: Normal rate.   Pulmonary/Chest: Effort normal. No respiratory distress.  Neurological: He is alert and oriented to person, place, and time.  Skin: Skin is warm and dry. He is not diaphoretic.  Psychiatric: He has a normal mood and affect. His behavior is normal.   Results for orders placed or performed in visit on 02/03/17  POCT rapid strep A  Result Value Ref Range   Rapid Strep A Screen Negative Negative     Assessment and Plan: William [name] is a 56 y.o. male who is here today for cc of sore throat and bodyaches. Likely flu vs viral uri Treat supportively Advised hydration and anti-pyretic use. Viral URI  Throat pain - Plan: POCT rapid strep A, Guaifenesin (MUCINEX MAXIMUM STRENGTH) 1200 MG TB12, Culture, Group A Strep  Trena Platt, PA-C [facility] 3/23/20189:49 AM

## 2017-02-06 LAB — CULTURE, GROUP A STREP: Strep A Culture: NEGATIVE

## 2023-10-04 ENCOUNTER — Other Ambulatory Visit: Payer: Self-pay

## 2023-10-06 ENCOUNTER — Ambulatory Visit
Admission: EM | Admit: 2023-10-06 | Discharge: 2023-10-06 | Disposition: A | Discharge status: Home / Self Care | Payer: Managed Care, Other (non HMO) | Attending: Emergency Medicine | Admitting: Emergency Medicine

## 2023-10-06 DIAGNOSIS — N3001 Acute cystitis with hematuria: Secondary | ICD-10-CM | POA: Diagnosis present

## 2023-10-06 LAB — POCT URINALYSIS DIP (MANUAL ENTRY)
Bilirubin, UA: NEGATIVE
Glucose, UA: NEGATIVE mg/dL
Ketones, POC UA: NEGATIVE mg/dL
Nitrite, UA: POSITIVE — AB
Protein Ur, POC: 30 mg/dL — AB
Spec Grav, UA: 1.025 (ref 1.010–1.025)
Urobilinogen, UA: 0.2 U/dL
pH, UA: 7 (ref 5.0–8.0)

## 2023-10-06 MED ORDER — SULFAMETHOXAZOLE-TRIMETHOPRIM 800-160 MG PO TABS: 1.0000 | ORAL_TABLET | Freq: Two times a day (BID) | ORAL | 0 refills | Status: AC

## 2023-10-06 NOTE — ED Triage Notes (Signed)
Pt presents with c/o foul odor in urine last week. States this morning he noticed blood in his urine.   Pt denies pain when voiding.

## 2023-10-06 NOTE — Discharge Instructions (Addendum)
Please take medication as prescribed. Take with food to avoid upset stomach. Finish the full course! Drink lots of fluids  Return if no change in symptoms after 3-4 days on the medicine Please contact your primary care provider for follow-up

## 2023-10-06 NOTE — ED Provider Notes (Signed)
[facility]    CSN: [id number] Arrival date & time: 10/06/23  1503      History   Chief Complaint Chief Complaint  Patient presents with   Hematuria    HPI William [name] is a 62 y.o. male.  A couple day history of foul-smelling urine.  This morning he noticed blood while urinating.  He is not having dysuria, urgency or frequency No abdominal or flank pain No fevers  He had a UTI with hematuria in 2021 Has seen urology in the past. 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    OSA on CPAP     Patient Active Problem List   Diagnosis Date Noted   Seborrheic dermatitis 10/05/2014   Cataract cortical, zonular 06/26/2014   Loss of weight 06/26/2014   HTN (hypertension) 12/27/2011   Hyperlipidemia 12/27/2011   GERD (gastroesophageal reflux disease) 12/27/2011   OSA on CPAP 12/27/2011   Hypogonadism male 12/27/2011   Migraine 12/27/2011   AR (allergic rhinitis) 12/27/2011   Anxiety 12/27/2011    Past Surgical History:  Procedure Laterality Date   CATARACT EXTRACTION W/PHACO Left 10/31/2013   Procedure: CATARACT EXTRACTION PHACO AND INTRAOCULAR LENS PLACEMENT (IOC);  Surgeon: Shade Flood, MD;  Location: [facility] OR;  Service: Ophthalmology;  Laterality: Left;   SHOULDER ARTHROSCOPY Right 2009   rotator cuff   SHOULDER ARTHROSCOPY WITH ROTATOR CUFF REPAIR     TESTICLE TORSION REDUCTION  1981       Home Medications    Prior to Admission medications   Medication Sig Start Date End Date Taking? Authorizing Provider  sulfamethoxazole-trimethoprim (BACTRIM DS) 800-160 MG tablet Take 1 tablet by mouth 2 (two) times daily for 7 days. 10/06/23 10/13/23 Yes Magaret Justo, Ray Church  aspirin  325 MG EC tablet Take 325 mg by mouth daily.    [provider]  latanoprost (XALATAN) 0.005 % ophthalmic solution Place 1 drop into both eyes at bedtime.    [provider]  Multiple Vitamin (MULTIVITAMIN WITH MINERALS) TABS tablet Take 1 tablet by mouth daily.    [provider]  pravastatin (PRAVACHOL) 40 MG tablet Take 40 mg by mouth daily.    [provider]  verapamil (VERELAN PM) 240 MG 24 hr capsule Take 240 mg by mouth daily.    [provider]    Family History Family History  Problem Relation Age of Onset   Cancer Father    Hypertension Father    Hyperlipidemia Father    Glaucoma Paternal Aunt    Stroke Mother    Dementia Mother    Anemia Mother        severe   Cancer Mother        colon   Hyperlipidemia Sister    Hypertension Sister    Headache Sister    Hypertension Brother    Hyperlipidemia Brother     Social History Social History   Tobacco Use   Smoking status: Never   Smokeless tobacco: Never  Substance Use Topics   Alcohol use: No    Alcohol/week: 0.0 standard drinks of alcohol   Drug use: No     Allergies  Patient has no known allergies.   Review of Systems Review of Systems  Genitourinary:  Positive for hematuria.   Per HPI  Physical Exam Triage Vital Signs ED Triage Vitals  Encounter Vitals Group     BP 10/06/23 1631 (!) 159/103     Systolic BP Percentile --      Diastolic BP Percentile --      Pulse Rate 10/06/23 1631 (!) 57     Resp 10/06/23 1631 17     Temp 10/06/23 1631 98 F (36.7 C)     Temp Source 10/06/23 1631 Oral     SpO2 10/06/23 1631 98 %     Weight --      Height --      Head Circumference --      Peak Flow --      Pain Score 10/06/23 1630 0     Pain Loc --      Pain Education --      Exclude from Growth Chart --    No data found.  Updated Vital Signs BP (!) 159/108 (BP Location: Right Arm) Comment: Shabana Armentrout, PA notified  Pulse (!) 57   Temp 98 F (36.7  C) (Oral)   Resp 17   SpO2 98%   Physical Exam Vitals and nursing note reviewed.  Constitutional:      General: He is not in acute distress.    Appearance: Normal appearance. He is not ill-appearing.  HENT:     Mouth/Throat:     Mouth: Mucous membranes are moist.     Pharynx: Oropharynx is clear.  Cardiovascular:     Rate and Rhythm: Normal rate and regular rhythm.     Pulses: Normal pulses.     Heart sounds: Normal heart sounds.  Pulmonary:     Effort: Pulmonary effort is normal.     Breath sounds: Normal breath sounds.  Abdominal:     Palpations: Abdomen is soft.     Tenderness: There is no abdominal tenderness. There is no right CVA tenderness, left CVA tenderness or guarding.  Skin:    General: Skin is warm and dry.  Neurological:     Mental Status: He is alert and oriented to person, place, and time.     UC Treatments / Results  Labs (all labs ordered are listed, but only abnormal results are displayed) Labs Reviewed  POCT URINALYSIS DIP (MANUAL ENTRY) - Abnormal; Notable for the following components:      Result Value   Color, UA straw (*)    Clarity, UA cloudy (*)    Blood, UA large (*)    Protein Ur, POC =30 (*)    Nitrite, UA Positive (*)    Leukocytes, UA Small (1+) (*)    All other components within normal limits  URINE CULTURE    EKG   Radiology No results found.  Procedures Procedures (including critical care time)  Medications Ordered in UC Medications - No data to display  Initial Impression / Assessment and Plan / UC Course  I have reviewed the triage vital signs and the nursing notes.  Pertinent labs & imaging results that were available during my care of the patient were reviewed by me and considered in my medical decision making (see chart for details).  UA small leuks, positive nitrates, large RBC Urine culture is pending Treat for acute cystitis with Bactrim twice daily for 7 days.  Discussed importance of continuing hydration,  proper hygiene, following up with primary care provider.  Return and ED precautions discussed.  Patient is agreeable to plan  Final Clinical Impressions(s) / UC Diagnoses   Final diagnoses:  Acute cystitis with hematuria     Discharge Instructions      Please take medication as prescribed. Take with food to avoid upset stomach. Finish the full course! Drink lots of fluids  Return if no change in symptoms after 3-4 days on the medicine Please contact your primary care provider for follow-up     ED Prescriptions     Medication Sig Dispense Auth. Provider   sulfamethoxazole-trimethoprim (BACTRIM DS) 800-160 MG tablet Take 1 tablet by mouth 2 (two) times daily for 7 days. 14 tablet Alvin Diffee, Lurena Joiner, PA-C      PDMP not reviewed this encounter.   Marlow Baars, [place] 10/06/23 1727

## 2023-10-08 ENCOUNTER — Telehealth (HOSPITAL_BASED_OUTPATIENT_CLINIC_OR_DEPARTMENT_OTHER): Payer: Self-pay | Admitting: Emergency Medicine

## 2023-10-08 LAB — URINE CULTURE: Culture: >=100000 — AB

## 2023-10-08 MED ORDER — CEPHALEXIN 500 MG PO CAPS: 500.0000 mg | ORAL_CAPSULE | Freq: Two times a day (BID) | ORAL | 0 refills | Status: AC

## 2024-08-07 ENCOUNTER — Other Ambulatory Visit (HOSPITAL_COMMUNITY): Payer: Self-pay
